# Patient Record
Sex: Female | Born: 1975 | Race: White | Hispanic: No | Marital: Married | Smoking: Former smoker
Health system: Southern US, Community
[De-identification: ages and names within clinical notes are randomized; demographics above are authoritative.]

## PROBLEM LIST (undated history)

## (undated) DIAGNOSIS — Z8041 Family history of malignant neoplasm of ovary: Secondary | ICD-10-CM

## (undated) DIAGNOSIS — Z800 Family history of malignant neoplasm of digestive organs: Secondary | ICD-10-CM

## (undated) DIAGNOSIS — O24419 Gestational diabetes mellitus in pregnancy, unspecified control: Secondary | ICD-10-CM

## (undated) DIAGNOSIS — T8859XA Other complications of anesthesia, initial encounter: Secondary | ICD-10-CM

## (undated) DIAGNOSIS — T4145XA Adverse effect of unspecified anesthetic, initial encounter: Secondary | ICD-10-CM

## (undated) HISTORY — DX: Family history of malignant neoplasm of digestive organs: Z80.0

## (undated) HISTORY — DX: Gestational diabetes mellitus in pregnancy, unspecified control: O24.419

## (undated) HISTORY — PX: DG CHOLECYSTOGRAPHY GALL BLADDER (ARMC HX): HXRAD1480

## (undated) HISTORY — DX: Family history of malignant neoplasm of ovary: Z80.41

---
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
## 2005-02-10 ENCOUNTER — Observation Stay: Payer: Self-pay | Admitting: Unknown Physician Specialty

## 2005-03-30 ENCOUNTER — Inpatient Hospital Stay: Payer: Self-pay | Admitting: Obstetrics & Gynecology

## 2006-04-23 LAB — BASIC METABOLIC PANEL
BUN: 11 mg/dL (ref 4–21)
CREATININE: 0.7 mg/dL (ref 0.5–1.1)
GLUCOSE: 91 mg/dL
SODIUM: 139 mmol/L (ref 137–147)

## 2006-04-23 LAB — LIPID PANEL
CHOLESTEROL: 233 mg/dL — AB (ref 0–200)
HDL: 87 mg/dL — AB (ref 35–70)
LDL Cholesterol: 125 mg/dL
LDl/HDL Ratio: 1.4
Triglycerides: 105 mg/dL (ref 40–160)

## 2006-04-23 LAB — CBC AND DIFFERENTIAL
NEUTROS ABS: 4 /uL
WBC: 7.8 10*3/mL

## 2006-04-23 LAB — HEPATIC FUNCTION PANEL: BILIRUBIN, TOTAL: 0.7 mg/dL

## 2008-10-01 ENCOUNTER — Ambulatory Visit: Payer: Self-pay | Admitting: Obstetrics & Gynecology

## 2008-10-02 ENCOUNTER — Inpatient Hospital Stay: Payer: Self-pay

## 2014-12-03 ENCOUNTER — Ambulatory Visit (INDEPENDENT_AMBULATORY_CARE_PROVIDER_SITE_OTHER): Payer: PRIVATE HEALTH INSURANCE | Admitting: Family Medicine

## 2014-12-03 DIAGNOSIS — Z23 Encounter for immunization: Secondary | ICD-10-CM

## 2014-12-18 ENCOUNTER — Encounter: Payer: Self-pay | Admitting: Emergency Medicine

## 2014-12-18 ENCOUNTER — Encounter: Payer: Self-pay | Admitting: Family Medicine

## 2014-12-18 ENCOUNTER — Emergency Department
Admission: EM | Admit: 2014-12-18 | Discharge: 2014-12-18 | Disposition: A | Discharge status: Home / Self Care | Payer: No Typology Code available for payment source | Attending: Emergency Medicine | Admitting: Emergency Medicine

## 2014-12-18 ENCOUNTER — Emergency Department: Payer: No Typology Code available for payment source

## 2014-12-18 ENCOUNTER — Ambulatory Visit (INDEPENDENT_AMBULATORY_CARE_PROVIDER_SITE_OTHER): Payer: PRIVATE HEALTH INSURANCE | Admitting: Family Medicine

## 2014-12-18 VITALS — BP 120/70 | HR 68 | Temp 98.1°F | Resp 16

## 2014-12-18 DIAGNOSIS — N200 Calculus of kidney: Secondary | ICD-10-CM | POA: Diagnosis not present

## 2014-12-18 DIAGNOSIS — R1031 Right lower quadrant pain: Secondary | ICD-10-CM

## 2014-12-18 DIAGNOSIS — Z3202 Encounter for pregnancy test, result negative: Secondary | ICD-10-CM | POA: Diagnosis not present

## 2014-12-18 DIAGNOSIS — R109 Unspecified abdominal pain: Secondary | ICD-10-CM | POA: Diagnosis present

## 2014-12-18 LAB — URINALYSIS COMPLETE WITH MICROSCOPIC (ARMC ONLY)
BILIRUBIN URINE: NEGATIVE
Bacteria, UA: NONE SEEN
GLUCOSE, UA: NEGATIVE mg/dL
Leukocytes, UA: NEGATIVE
NITRITE: NEGATIVE
Protein, ur: 30 mg/dL — AB
Specific Gravity, Urine: 1.019 (ref 1.005–1.030)
pH: 7 (ref 5.0–8.0)

## 2014-12-18 LAB — COMPREHENSIVE METABOLIC PANEL
ALBUMIN: 4.4 g/dL (ref 3.5–5.0)
ALK PHOS: 78 U/L (ref 38–126)
ALT: 51 U/L (ref 14–54)
AST: 50 U/L — AB (ref 15–41)
Anion gap: 13 (ref 5–15)
BILIRUBIN TOTAL: 1.4 mg/dL — AB (ref 0.3–1.2)
BUN: 12 mg/dL (ref 6–20)
CALCIUM: 9.4 mg/dL (ref 8.9–10.3)
CO2: 20 mmol/L — AB (ref 22–32)
CREATININE: 0.81 mg/dL (ref 0.44–1.00)
Chloride: 107 mmol/L (ref 101–111)
GFR calc Af Amer: >60 mL/min (ref >60)
GFR calc non Af Amer: >60 mL/min (ref >60)
GLUCOSE: 122 mg/dL — AB (ref 65–99)
Potassium: 2.9 mmol/L — CL (ref 3.5–5.1)
SODIUM: 140 mmol/L (ref 135–145)
TOTAL PROTEIN: 7.2 g/dL (ref 6.5–8.1)

## 2014-12-18 LAB — POCT URINALYSIS DIPSTICK
BILIRUBIN UA: NEGATIVE
Glucose, UA: NEGATIVE
LEUKOCYTES UA: NEGATIVE
NITRITE UA: NEGATIVE
PH UA: 7.5
PROTEIN UA: NEGATIVE
Spec Grav, UA: 1.015
Urobilinogen, UA: 1

## 2014-12-18 LAB — CBC WITH DIFFERENTIAL/PLATELET
BASOS PCT: 1 %
Basophils Absolute: 0.1 10*3/uL (ref 0–0.1)
EOS ABS: 0.1 10*3/uL (ref 0–0.7)
Eosinophils Relative: 1 %
HEMATOCRIT: 40.9 % (ref 35.0–47.0)
HEMOGLOBIN: 13.9 g/dL (ref 12.0–16.0)
Lymphocytes Relative: 25 %
Lymphs Abs: 2.5 10*3/uL (ref 1.0–3.6)
MCH: 30.5 pg (ref 26.0–34.0)
MCHC: 34 g/dL (ref 32.0–36.0)
MCV: 89.9 fL (ref 80.0–100.0)
MONOS PCT: 8 %
Monocytes Absolute: 0.8 10*3/uL (ref 0.2–0.9)
NEUTROS ABS: 6.5 10*3/uL (ref 1.4–6.5)
NEUTROS PCT: 65 %
Platelets: 285 10*3/uL (ref 150–440)
RBC: 4.55 MIL/uL (ref 3.80–5.20)
RDW: 13.2 % (ref 11.5–14.5)
WBC: 10 10*3/uL (ref 3.6–11.0)

## 2014-12-18 LAB — POCT URINE PREGNANCY: Preg Test, Ur: NEGATIVE

## 2014-12-18 LAB — POCT PREGNANCY, URINE: PREG TEST UR: NEGATIVE

## 2014-12-18 MED ORDER — ONDANSETRON HCL 4 MG/2ML IJ SOLN
4.0000 mg | Freq: Once | INTRAMUSCULAR | Status: AC
  Administered 2014-12-18: 4 mg via INTRAVENOUS

## 2014-12-18 MED ORDER — KETOROLAC TROMETHAMINE 30 MG/ML IJ SOLN
30.0000 mg | Freq: Once | INTRAMUSCULAR | Status: AC
  Administered 2014-12-18: 30 mg via INTRAVENOUS

## 2014-12-18 MED ORDER — OXYCODONE-ACETAMINOPHEN 5-325 MG PO TABS: 1.0000 | ORAL_TABLET | ORAL | Status: DC | PRN

## 2014-12-18 MED ORDER — ONDANSETRON HCL 4 MG PO TABS: 4.0000 mg | ORAL_TABLET | Freq: Three times a day (TID) | ORAL | Status: DC | PRN

## 2014-12-18 MED ORDER — ONDANSETRON HCL 4 MG/2ML IJ SOLN
INTRAMUSCULAR | Status: AC
  Administered 2014-12-18: 4 mg via INTRAVENOUS
  Filled 2014-12-18: qty 2

## 2014-12-18 MED ORDER — HYDROMORPHONE HCL 1 MG/ML IJ SOLN
1.0000 mg | INTRAMUSCULAR | Status: DC | PRN
  Administered 2014-12-18: 1 mg via INTRAVENOUS
  Filled 2014-12-18: qty 1

## 2014-12-18 MED ORDER — MORPHINE SULFATE (PF) 4 MG/ML IV SOLN
INTRAVENOUS | Status: AC
  Administered 2014-12-18: 4 mg via INTRAVENOUS
  Filled 2014-12-18: qty 1

## 2014-12-18 MED ORDER — KETOROLAC TROMETHAMINE 30 MG/ML IJ SOLN
INTRAMUSCULAR | Status: AC
Start: 2014-12-18 — End: 2014-12-18
  Administered 2014-12-18: 30 mg via INTRAVENOUS
  Filled 2014-12-18: qty 1

## 2014-12-18 MED ORDER — ONDANSETRON HCL 4 MG/2ML IJ SOLN
INTRAMUSCULAR | Status: AC
Start: 2014-12-18 — End: 2014-12-18
  Administered 2014-12-18: 4 mg via INTRAVENOUS
  Filled 2014-12-18: qty 2

## 2014-12-18 MED ORDER — MORPHINE SULFATE (PF) 4 MG/ML IV SOLN
4.0000 mg | Freq: Once | INTRAVENOUS | Status: AC
  Administered 2014-12-18: 4 mg via INTRAVENOUS

## 2014-12-18 MED ORDER — TAMSULOSIN HCL 0.4 MG PO CAPS: 0.4000 mg | ORAL_CAPSULE | Freq: Every day | ORAL | Status: DC

## 2014-12-18 NOTE — ED Provider Notes (Signed)
[facility] Emergency Department Provider Note    ____________________________________________  Time seen: 1215 I have reviewed the triage vital signs and the nursing notes.   HISTORY  Chief Complaint Flank Pain   History limited by: Not Limited   HPI Emily Warren is a 39 y.o. female who presents to the emergency department today with concerns for right flank pain. The patient states that the pain started suddenly. It started roughly 3 hours prior to my evaluation. It has been constant with a couple episodes of worsening pain. She is unable to describe whether it is a sharp, burning, cramping pain. She simply says hurts. It has been accompanied by nausea and vomiting. She denies any fevers. Denies any dysuria. She denies any diarrhea. She states that both her brother and her mother have had kidney stones in the past.   History reviewed. No pertinent past medical history.  Patient Active Problem List   Diagnosis Date Noted  . Ganglion 08/16/2009    History reviewed. No pertinent past surgical history.  No current outpatient prescriptions on file.  Allergies Review of patient's allergies indicates no known allergies.  No family history on file.  Social History Social History  Substance Use Topics  . Smoking status: Never Smoker   . Smokeless tobacco: None  . Alcohol Use: No    Review of Systems  Constitutional: Negative for fever. Cardiovascular: Negative for chest pain. Respiratory: Negative for shortness of breath. Gastrointestinal: Right flank pain. Nausea. Vomiting. Genitourinary: Negative for dysuria. Musculoskeletal: Negative for back pain. Skin: Negative for rash. Neurological: Negative for headaches, focal weakness or numbness.   10-point ROS otherwise negative.  ____________________________________________   PHYSICAL EXAM:  VITAL SIGNS: ED Triage Vitals  Enc Vitals Group     BP 12/18/14 1102 105/80 mmHg     Pulse  Rate 12/18/14 1102 73     Resp 12/18/14 1102 18     Temp 12/18/14 1102 97.6 F (36.4 C)     Temp Source 12/18/14 1102 Oral     SpO2 12/18/14 1102 97 %     Weight 12/18/14 1102 205 lb (92.987 kg)     Height 12/18/14 1102  (1.651 m)     Head Cir --      Peak Flow --      Pain Score 12/18/14 1102 8   Constitutional: Alert and oriented. Appears mildly uncomfortable Eyes: Conjunctivae are normal. PERRL. Normal extraocular movements. ENT   Head: Normocephalic and atraumatic.   Nose: No congestion/rhinnorhea.   Mouth/Throat: Mucous membranes are moist.   Neck: No stridor. Hematological/Lymphatic/Immunilogical: No cervical lymphadenopathy. Cardiovascular: Normal rate, regular rhythm.  No murmurs, rubs, or gallops. Respiratory: Normal respiratory effort without tachypnea nor retractions. Breath sounds are clear and equal bilaterally. No wheezes/rales/rhonchi. Gastrointestinal: Soft. Mild right-sided CVA tenderness. Mild right-sided abdominal pain. Genitourinary: Deferred Musculoskeletal: Normal range of motion in all extremities. No joint effusions.  No lower extremity tenderness nor edema. Neurologic:  Normal speech and language. No gross focal neurologic deficits are appreciated. Speech is normal.  Skin:  Skin is warm, dry and intact. No rash noted. Psychiatric: Mood and affect are normal. Speech and behavior are normal. Patient exhibits appropriate insight and judgment.  ____________________________________________    LABS (pertinent positives/negatives)  Labs Reviewed  COMPREHENSIVE METABOLIC PANEL - Abnormal; Notable for the following:    Potassium 2.9 (*)    CO2 20 (*)    Glucose, Bld 122 (*)    AST 50 (*)    Total  Bilirubin 1.4 (*)    All other components within normal limits  URINALYSIS COMPLETEWITH MICROSCOPIC ([facility] ONLY) - Abnormal; Notable for the following:    Color, Urine YELLOW (*)    APPearance CLEAR (*)    Ketones, ur 2+ (*)    Hgb urine dipstick  2+ (*)    Protein, ur 30 (*)    Squamous Epithelial / LPF 0-5 (*)    All other components within normal limits  CBC WITH DIFFERENTIAL/PLATELET  POCT PREGNANCY, URINE     ____________________________________________   EKG  None  ____________________________________________    RADIOLOGY  US Renal IMPRESSION: Study within normal limits.   ____________________________________________   PROCEDURES  Procedure(s) performed: None  Critical Care performed: No  ____________________________________________   INITIAL IMPRESSION / ASSESSMENT AND PLAN / ED COURSE  Pertinent labs & imaging results that were available during my care of the patient were reviewed by me and considered in my medical decision making (see chart for details).  Patient presents to the emergency department today with right flank pain. Urine is consistent with a kidney stone. At this time no sign of an infected stone. Renal ultrasound without any hydronephrosis. Will plan on discharging home with pain medication, antiemetics and Flomax  ____________________________________________   FINAL CLINICAL IMPRESSION(S) / ED DIAGNOSES  Final diagnoses:  Kidney stone  Flank pain     Phineas Semen, MD 12/18/14 [phone]

## 2014-12-18 NOTE — Progress Notes (Signed)
       Patient: Emily Warren Female    DOB: 1975-04-30   39 y.o.   MRN: [id number] Visit Date: 12/18/2014  Today's Provider: Mila Merry, MD   Chief Complaint  Patient presents with  . Flank Pain    Right   Subjective:    HPI  Right side pain started at 9:30 am this morning, came on suddenly. Radiates from side to back and down right buttocks. Was just walking in home when pain started. Was no lifting or carrying anything. Had no preceding illnesses. No fevers, chills, sweats. No dysuria or frequency. No constipation or diarrhea. States she feels numb all along right side of body.   Allergies not on file Previous Medications   No medications on file    Review of Systems  See HPI  Social History  Substance Use Topics  . Smoking status: Not on file  . Smokeless tobacco: Not on file  . Alcohol Use: Not on file   Objective:   BP 120/70 mmHg  Pulse 68  Temp(Src) 98.1 F (36.7 C) (Oral)  Resp 16  SpO2 100%  LMP 12/06/2014  Physical Exam  General: Patient in moderate to severe distress with pain. Awake, alert and oriented.  Back: No spine or CVA tenderness Abdomen: Mild RLQ tenderness with rebound and voluntary guarding.  Neuro: Normal LE strength    Assessment & Plan:     1. Right lower quadrant pain  Acute onset, patient in moderate to severe distress due to pain, which is unusual for this patient. Counseled of extensive Ddx including acute abdomen, kidney stones, herniated lumbar disk, ectopic pregnancy and ovarian rupture which will require additional diagnostic workup. Due to the acuity of her pain she was advised she should go to the emergency room for urgent evaluation. Her mother-in-law is with her and instructed to immediately go to ER for prompt evaluation. Patient agrees to do so. ER triage notified.       Mila Merry, MD  [facility]

## 2014-12-18 NOTE — ED Notes (Signed)
C/o right flank pain that radiates to RLQ area that started around 0930, denies any urinary symptoms

## 2014-12-18 NOTE — Discharge Instructions (Signed)
Please seek medical attention for any high fevers, chest pain, shortness of breath, change in behavior, persistent vomiting, bloody stool or any other new or concerning symptoms. ° ° °Kidney Stones °Kidney stones (urolithiasis) are deposits that form inside your kidneys. The intense pain is caused by the stone moving through the urinary tract. When the stone moves, the ureter goes into spasm around the stone. The stone is usually passed in the urine.  °CAUSES  °· A disorder that makes certain neck glands produce too much parathyroid hormone (primary hyperparathyroidism). °· A buildup of uric acid crystals, similar to gout in your joints. °· Narrowing (stricture) of the ureter. °· A kidney obstruction present at birth (congenital obstruction). °· Previous surgery on the kidney or ureters. °· Numerous kidney infections. °SYMPTOMS  °· Feeling sick to your stomach (nauseous). °· Throwing up (vomiting). °· Blood in the urine (hematuria). °· Pain that usually spreads (radiates) to the groin. °· Frequency or urgency of urination. °DIAGNOSIS  °· Taking a history and physical exam. °· Blood or urine tests. °· CT scan. °· Occasionally, an examination of the inside of the urinary bladder (cystoscopy) is performed. °TREATMENT  °· Observation. °· Increasing your fluid intake. °· Extracorporeal shock wave lithotripsy--This is a noninvasive procedure that uses shock waves to break up kidney stones. °· Surgery may be needed if you have severe pain or persistent obstruction. There are various surgical procedures. Most of the procedures are performed with the use of small instruments. Only small incisions are needed to accommodate these instruments, so recovery time is minimized. °The size, location, and chemical composition are all important variables that will determine the proper choice of action for you. Talk to your health care provider to better understand your situation so that you will minimize the risk of injury to yourself  and your kidney.  °HOME CARE INSTRUCTIONS  °· Drink enough water and fluids to keep your urine clear or pale yellow. This will help you to pass the stone or stone fragments. °· Strain all urine through the provided strainer. Keep all particulate matter and stones for your health care provider to see. The stone causing the pain may be as small as a grain of salt. It is very important to use the strainer each and every time you pass your urine. The collection of your stone will allow your health care provider to analyze it and verify that a stone has actually passed. The stone analysis will often identify what you can do to reduce the incidence of recurrences. °· Only take over-the-counter or prescription medicines for pain, discomfort, or fever as directed by your health care provider. °· Keep all follow-up visits as told by your health care provider. This is important. °· Get follow-up X-rays if required. The absence of pain does not always mean that the stone has passed. It may have only stopped moving. If the urine remains completely obstructed, it can cause loss of kidney function or even complete destruction of the kidney. It is your responsibility to make sure X-rays and follow-ups are completed. Ultrasounds of the kidney can show blockages and the status of the kidney. Ultrasounds are not associated with any radiation and can be performed easily in a matter of minutes. °· Make changes to your daily diet as told by your health care provider. You may be told to: °¨ Limit the amount of salt that you eat. °¨ Eat 5 or more servings of fruits and vegetables each day. °¨ Limit the amount of meat,   poultry, fish, and eggs that you eat. °· Collect a 24-hour urine sample as told by your health care provider. You may need to collect another urine sample every 6-12 months. °SEEK MEDICAL CARE IF: °· You experience pain that is progressive and unresponsive to any pain medicine you have been prescribed. °SEEK IMMEDIATE  MEDICAL CARE IF:  °· Pain cannot be controlled with the prescribed medicine. °· You have a fever or shaking chills. °· The severity or intensity of pain increases over 18 hours and is not relieved by pain medicine. °· You develop a new onset of abdominal pain. °· You feel faint or pass out. °· You are unable to urinate. °  °This information is not intended to replace advice given to you by your health care provider. Make sure you discuss any questions you have with your health care provider. °  °Document Released: 02/23/2005 Document Revised: 11/14/2014 Document Reviewed: 07/27/2012 °Elsevier Interactive Patient Education ©2016 [employer] Inc[employer] ° °

## 2014-12-20 ENCOUNTER — Encounter: Payer: Self-pay | Admitting: Family Medicine

## 2014-12-20 ENCOUNTER — Ambulatory Visit (INDEPENDENT_AMBULATORY_CARE_PROVIDER_SITE_OTHER): Payer: PRIVATE HEALTH INSURANCE | Admitting: Family Medicine

## 2014-12-20 VITALS — BP 118/80 | HR 78 | Temp 98.6°F | Resp 16 | Wt 208.0 lb

## 2014-12-20 DIAGNOSIS — N200 Calculus of kidney: Secondary | ICD-10-CM | POA: Diagnosis not present

## 2014-12-20 NOTE — Progress Notes (Signed)
Patient ID: Emily Warren Broussard, female   DOB: 05/20/1975, 39 y.o.   MRN: [id number]   Emily Warren Emminger  MRN: [id number] DOB: 05/10/1975  Subjective:  HPI   1. Kidney stone Patient is a 39 year old female who presents for follow up after being seen in the ER for kidney stones 2 days ago.  She states she feels much better, not 100% but if she wasn't told to follow up she feels she wold not need to be here.  Patient did share that she has a strong family history of kidney stones but this was the first for her.   The ER put her on Flomax but the patient has not started it and feels she does not need it unless told today to take it. Patient has strong family history of kidney stones.   Patient Active Problem List   Diagnosis Date Noted  . Ganglion 08/16/2009    No past medical history on file.  Social History   Social History  . Marital Status: Married    Spouse Name: N/A  . Number of Children: N/A  . Years of Education: N/A   Occupational History  . Not on file.   Social History Main Topics  . Smoking status: Never Smoker   . Smokeless tobacco: Not on file  . Alcohol Use: No  . Drug Use: No  . Sexual Activity: Not on file   Other Topics Concern  . Not on file   Social History Narrative    Outpatient Prescriptions Prior to Visit  Medication Sig Dispense Refill  . ondansetron (ZOFRAN) 4 MG tablet Take 1 tablet (4 mg total) by mouth every 8 (eight) hours as needed for nausea or vomiting. 20 tablet 0  . oxyCODONE-acetaminophen (ROXICET) 5-325 MG tablet Take 1 tablet by mouth every 4 (four) hours as needed for severe pain. 15 tablet 0  . tamsulosin (FLOMAX) 0.4 MG CAPS capsule Take 1 capsule (0.4 mg total) by mouth daily. (Patient not taking: Reported on 12/20/2014) 14 capsule 0   No facility-administered medications prior to visit.    No Known Allergies  Review of Systems  Constitutional: Negative for fever and chills.  Respiratory: Negative for cough, hemoptysis,  shortness of breath and wheezing.   Cardiovascular: Negative for chest pain, palpitations, orthopnea and leg swelling.  Gastrointestinal: Negative.   Genitourinary: Negative for dysuria, urgency, frequency, hematuria and flank pain.       Pressure when she has to urinate and occasionally after she urinates.  Neurological: Negative for dizziness, weakness and headaches.  Psychiatric/Behavioral: Negative.    Objective:  BP 118/80 mmHg  Pulse 78  Temp(Src) 98.6 F (37 C) (Oral)  Resp 16  Wt 208 lb (94.348 kg)  LMP 12/06/2014  Physical Exam  Constitutional: She is oriented to person, place, and time and well-developed, well-nourished, and in no distress.  HENT:  Head: Normocephalic and atraumatic.  Right Ear: External ear normal.  Left Ear: External ear normal.  Nose: Nose normal.  Eyes: Conjunctivae are normal.  Cardiovascular: Normal rate, regular rhythm and normal heart sounds.   Pulmonary/Chest: Effort normal and breath sounds normal.  Abdominal: Soft. She exhibits no distension and no mass. There is no tenderness. There is no rebound and no guarding.  Neurological: She is alert and oriented to person, place, and time.  Skin: Skin is warm and dry.  Psychiatric: Mood, memory, affect and judgment normal.    Assessment and Plan :  Kidney stone  1. Kidney stone No  treatment needed if this does not recur. If it does recur or refer to urology. Patient advised push fluids. Take Flomax for 2 weeks plus an anti-inflammatory daily for those 2 weeks 2. Obesity Patient has had a 25 pound intentional weight loss with diet and exercises here. Julieanne Manson MD [facility] 12/20/2014 11:02 AM

## 2016-02-04 LAB — HM PAP SMEAR

## 2016-02-04 LAB — OB RESULTS CONSOLE RUBELLA ANTIBODY, IGM: RUBELLA: IMMUNE

## 2016-02-04 LAB — OB RESULTS CONSOLE HGB/HCT, BLOOD
HCT: 38 %
HEMOGLOBIN: 13.1 g/dL

## 2016-02-04 LAB — OB RESULTS CONSOLE ABO/RH: RH TYPE: POSITIVE

## 2016-02-04 LAB — OB RESULTS CONSOLE ANTIBODY SCREEN: Antibody Screen: NEGATIVE

## 2016-02-04 LAB — OB RESULTS CONSOLE VARICELLA ZOSTER ANTIBODY, IGG: Varicella: IMMUNE

## 2016-02-04 LAB — OB RESULTS CONSOLE HEPATITIS B SURFACE ANTIGEN: HEP B S AG: NEGATIVE

## 2016-02-04 LAB — OB RESULTS CONSOLE PLATELET COUNT: Platelets: 282 10*3/uL

## 2016-02-04 LAB — OB RESULTS CONSOLE RPR: RPR: NONREACTIVE

## 2016-02-04 LAB — OB RESULTS CONSOLE GC/CHLAMYDIA
Chlamydia: NEGATIVE
GC PROBE AMP, GENITAL: NEGATIVE

## 2016-02-04 LAB — OB RESULTS CONSOLE HIV ANTIBODY (ROUTINE TESTING): HIV: NONREACTIVE

## 2016-05-07 ENCOUNTER — Other Ambulatory Visit: Payer: Self-pay | Admitting: Obstetrics and Gynecology

## 2016-05-07 DIAGNOSIS — IMO0002 Reserved for concepts with insufficient information to code with codable children: Secondary | ICD-10-CM

## 2016-05-07 DIAGNOSIS — Z0489 Encounter for examination and observation for other specified reasons: Secondary | ICD-10-CM

## 2016-05-08 ENCOUNTER — Telehealth: Payer: Self-pay

## 2016-05-08 DIAGNOSIS — Z20828 Contact with and (suspected) exposure to other viral communicable diseases: Secondary | ICD-10-CM

## 2016-05-08 MED ORDER — OSELTAMIVIR PHOSPHATE 75 MG PO CAPS: 75.0000 mg | ORAL_CAPSULE | Freq: Every day | ORAL | 0 refills | Status: AC

## 2016-05-08 NOTE — Telephone Encounter (Signed)
Pt is 22 wks.  Her son was dx'd yesterday c flu type B and was given tamiflu.  Any precautions for her?

## 2016-05-08 NOTE — Telephone Encounter (Signed)
Pt aware.

## 2016-05-08 NOTE — Telephone Encounter (Signed)
Prevention with Tamiflu.  Will Rx.  Dose is once daily to prevent for 10 days

## 2016-05-14 LAB — HPV APTIMA: HPV APTIMA: NEGATIVE

## 2016-05-15 ENCOUNTER — Ambulatory Visit (INDEPENDENT_AMBULATORY_CARE_PROVIDER_SITE_OTHER): Payer: Medicaid Other

## 2016-05-15 ENCOUNTER — Ambulatory Visit (INDEPENDENT_AMBULATORY_CARE_PROVIDER_SITE_OTHER): Payer: No Typology Code available for payment source | Admitting: Obstetrics & Gynecology

## 2016-05-15 ENCOUNTER — Telehealth: Payer: Self-pay | Admitting: Obstetrics & Gynecology

## 2016-05-15 VITALS — BP 110/60 | HR 82 | Wt 205.0 lb

## 2016-05-15 DIAGNOSIS — Z362 Encounter for other antenatal screening follow-up: Secondary | ICD-10-CM

## 2016-05-15 DIAGNOSIS — Z0489 Encounter for examination and observation for other specified reasons: Secondary | ICD-10-CM

## 2016-05-15 DIAGNOSIS — Z131 Encounter for screening for diabetes mellitus: Secondary | ICD-10-CM

## 2016-05-15 DIAGNOSIS — Z3A23 23 weeks gestation of pregnancy: Secondary | ICD-10-CM

## 2016-05-15 DIAGNOSIS — IMO0002 Reserved for concepts with insufficient information to code with codable children: Secondary | ICD-10-CM

## 2016-05-15 DIAGNOSIS — Z98891 History of uterine scar from previous surgery: Secondary | ICD-10-CM | POA: Insufficient documentation

## 2016-05-15 DIAGNOSIS — Z3490 Encounter for supervision of normal pregnancy, unspecified, unspecified trimester: Secondary | ICD-10-CM | POA: Insufficient documentation

## 2016-05-15 NOTE — Telephone Encounter (Signed)
Lmtrc

## 2016-05-18 NOTE — Telephone Encounter (Signed)
Lmtrc

## 2016-05-19 NOTE — Telephone Encounter (Signed)
Patient is aware of H&P on 09/02/16 @ 8am with Pre-Admit Testing to follow, and OR on 09/03/16.

## 2016-06-16 ENCOUNTER — Ambulatory Visit (INDEPENDENT_AMBULATORY_CARE_PROVIDER_SITE_OTHER): Payer: Medicaid Other | Admitting: Obstetrics & Gynecology

## 2016-06-16 ENCOUNTER — Other Ambulatory Visit: Payer: Medicaid Other

## 2016-06-16 VITALS — BP 120/70 | Wt 208.0 lb

## 2016-06-16 DIAGNOSIS — Z3490 Encounter for supervision of normal pregnancy, unspecified, unspecified trimester: Secondary | ICD-10-CM

## 2016-06-16 DIAGNOSIS — Z3A27 27 weeks gestation of pregnancy: Secondary | ICD-10-CM

## 2016-06-16 DIAGNOSIS — Z98891 History of uterine scar from previous surgery: Secondary | ICD-10-CM

## 2016-06-16 NOTE — Progress Notes (Signed)
Glc today, BTL papers signed, plans CS 6/28 (PH), PNV, [facility], back pain discussed.

## 2016-06-17 ENCOUNTER — Telehealth: Payer: Self-pay | Admitting: Obstetrics & Gynecology

## 2016-06-17 LAB — 28 WEEK RH+PANEL
BASOS ABS: 0 10*3/uL (ref 0.0–0.2)
Basos: 0 %
EOS (ABSOLUTE): 0.3 10*3/uL (ref 0.0–0.4)
Eos: 3 %
Gestational Diabetes Screen: 163 mg/dL — ABNORMAL HIGH (ref 65–139)
HEMATOCRIT: 34.3 % (ref 34.0–46.6)
HIV SCREEN 4TH GENERATION: NONREACTIVE
Hemoglobin: 11.7 g/dL (ref 11.1–15.9)
Immature Grans (Abs): 0.1 10*3/uL (ref 0.0–0.1)
Immature Granulocytes: 1 %
LYMPHS ABS: 1.9 10*3/uL (ref 0.7–3.1)
LYMPHS: 15 %
MCH: 30.2 pg (ref 26.6–33.0)
MCHC: 34.1 g/dL (ref 31.5–35.7)
MCV: 89 fL (ref 79–97)
MONOCYTES: 3 %
Monocytes Absolute: 0.4 10*3/uL (ref 0.1–0.9)
Neutrophils Absolute: 9.8 10*3/uL — ABNORMAL HIGH (ref 1.4–7.0)
Neutrophils: 78 %
Platelets: 249 10*3/uL (ref 150–379)
RBC: 3.87 x10E6/uL (ref 3.77–5.28)
RDW: 12.9 % (ref 12.3–15.4)
RPR Ser Ql: NONREACTIVE
WBC: 12.4 10*3/uL — ABNORMAL HIGH (ref 3.4–10.8)

## 2016-06-17 NOTE — Progress Notes (Signed)
Pt has elevated Glucola and is need of 3 hour GTT.  Please help arrange.  Thanks.

## 2016-06-17 NOTE — Progress Notes (Signed)
Please call and schedule 3 hr, pt aware you will be calling, maybe she can do it with next appt

## 2016-06-17 NOTE — Telephone Encounter (Signed)
Pt is schedule on 07/07/16 At 8:20

## 2016-06-18 NOTE — Telephone Encounter (Signed)
Per Dr. Tiburcio Pea to schedule an 3 hour GTT

## 2016-07-07 ENCOUNTER — Other Ambulatory Visit: Payer: Medicaid Other

## 2016-07-08 ENCOUNTER — Ambulatory Visit (INDEPENDENT_AMBULATORY_CARE_PROVIDER_SITE_OTHER): Payer: Medicaid Other | Admitting: Obstetrics and Gynecology

## 2016-07-08 ENCOUNTER — Other Ambulatory Visit: Payer: Medicaid Other

## 2016-07-08 VITALS — BP 116/60 | Wt 212.0 lb

## 2016-07-08 DIAGNOSIS — Z23 Encounter for immunization: Secondary | ICD-10-CM | POA: Diagnosis not present

## 2016-07-08 DIAGNOSIS — Z3A30 30 weeks gestation of pregnancy: Secondary | ICD-10-CM | POA: Diagnosis not present

## 2016-07-08 DIAGNOSIS — O99810 Abnormal glucose complicating pregnancy: Secondary | ICD-10-CM

## 2016-07-08 NOTE — Progress Notes (Signed)
102004  

## 2016-07-08 NOTE — Addendum Note (Signed)
Addended by: Kathlene Cote on: 07/08/2016 08:12 AM   Modules accepted: Orders

## 2016-07-08 NOTE — Progress Notes (Signed)
3 hour GTT today/TDAP given today/questions regarding the blood transfusion

## 2016-07-09 ENCOUNTER — Other Ambulatory Visit: Payer: Self-pay | Admitting: Obstetrics and Gynecology

## 2016-07-09 ENCOUNTER — Telehealth: Payer: Self-pay

## 2016-07-09 DIAGNOSIS — O24410 Gestational diabetes mellitus in pregnancy, diet controlled: Secondary | ICD-10-CM

## 2016-07-09 LAB — GESTATIONAL GLUCOSE TOLERANCE
GLUCOSE 2 HOUR GTT: 163 mg/dL — AB (ref 65–154)
GLUCOSE 3 HOUR GTT: 48 mg/dL — AB (ref 65–139)
Glucose, Fasting: 73 mg/dL (ref 65–94)
Glucose, GTT - 1 Hour: 181 mg/dL — ABNORMAL HIGH (ref 65–179)

## 2016-07-09 MED ORDER — GLUCOSE BLOOD VI STRP: ORAL_STRIP | 12 refills | Status: DC

## 2016-07-09 MED ORDER — ACCU-CHEK NANO SMARTVIEW W/DEVICE KIT: 1.0000 | PACK | 0 refills | Status: DC

## 2016-07-09 MED ORDER — ACCU-CHEK FASTCLIX LANCETS MISC: 1.0000 [IU] | Freq: Four times a day (QID) | 12 refills | Status: DC

## 2016-07-09 NOTE — Telephone Encounter (Signed)
Left voicemail GDM lifestyle referral and testing supplies sent

## 2016-07-09 NOTE — Telephone Encounter (Signed)
Please advise 

## 2016-07-09 NOTE — Progress Notes (Signed)
Pt states she is not concerned about this since she passed 2 of the 4 test.  Pt advised she would be referred to the lifestyle center, [facility]

## 2016-07-09 NOTE — Progress Notes (Signed)
Left message to return call 

## 2016-07-09 NOTE — Telephone Encounter (Signed)
Pt calling for results of 3hr GTT.  Okay to leave on vm.  [phone]

## 2016-07-15 ENCOUNTER — Encounter: Payer: No Typology Code available for payment source | Admitting: Certified Nurse Midwife

## 2016-07-15 ENCOUNTER — Other Ambulatory Visit: Payer: No Typology Code available for payment source

## 2016-07-17 ENCOUNTER — Encounter: Payer: Self-pay | Admitting: *Deleted

## 2016-07-17 ENCOUNTER — Encounter: Payer: Medicaid Other | Attending: Obstetrics & Gynecology | Admitting: *Deleted

## 2016-07-17 VITALS — BP 108/62 | Ht 64.5 in | Wt 212.4 lb

## 2016-07-17 DIAGNOSIS — Z3A00 Weeks of gestation of pregnancy not specified: Secondary | ICD-10-CM | POA: Diagnosis not present

## 2016-07-17 DIAGNOSIS — O24410 Gestational diabetes mellitus in pregnancy, diet controlled: Secondary | ICD-10-CM | POA: Diagnosis not present

## 2016-07-17 NOTE — Progress Notes (Signed)
Diabetes Self-Management Education  Visit Type: First/Initial  Appt. Start Time: 1010 Appt. End Time: 8546  07/17/2016  [name], identified by name and date of birth, is a 41 y.o. female with a diagnosis of Diabetes: Gestational Diabetes.   ASSESSMENT  Blood pressure 108/62, height 5' 4.5" (1.638 m), weight 212 lb 6.4 oz (96.3 kg), last menstrual period 12/05/2015. Body mass index is 35.9 kg/m.      Diabetes Self-Management Education - 07/17/16 1201      Visit Information   Visit Type First/Initial     Initial Visit   Diabetes Type Gestational Diabetes   Are you currently following a meal plan? No   Are you taking your medications as prescribed? Yes   Date Diagnosed 07/08/16     Health Coping   How would you rate your overall health? Good     Psychosocial Assessment   Patient Belief/Attitude about Diabetes Motivated to manage diabetes   Self-care barriers None   Self-management support Doctor's office;Friends;Family   Patient Concerns Nutrition/Meal planning;Glycemic Control   Special Needs None   Preferred Learning Style Auditory   Learning Readiness Ready   How often do you need to have someone help you when you read instructions, pamphlets, or other written materials from your doctor or pharmacy? 1 - Never   What is the last grade level you completed in school? college     Pre-Education Assessment   Patient understands the diabetes disease and treatment process. Needs Instruction   Patient understands incorporating nutritional management into lifestyle. Needs Instruction   Patient undertands incorporating physical activity into lifestyle. Needs Instruction   Patient understands using medications safely. Needs Instruction   Patient understands monitoring blood glucose, interpreting and using results Needs Review   Patient understands prevention, detection, and treatment of acute complications. Needs Instruction   Patient understands prevention, detection,  and treatment of chronic complications. Needs Instruction   Patient understands how to develop strategies to address psychosocial issues. Needs Instruction   Patient understands how to develop strategies to promote health/change behavior. Needs Instruction     Complications   How often do you check your blood sugar? 3-4 times/day   Fasting Blood glucose range (mg/dL) 70-129  FBG's 97, 101, 80 and 83 mg/dL.    Postprandial Blood glucose range (mg/dL) 70-129;130-179  pp's breakfast 86, 93, 104, 96, 122 mg/dL; pp lunch 109, 131, 118, 84 mg/dL; pp supper 142, 80, 98, 114 mg/dL.    Have you had a dilated eye exam in the past 12 months? Yes   Have you had a dental exam in the past 12 months? Yes   Are you checking your feet? No     Dietary Intake   Breakfast setak and cheese biscuit; eggs with cheese and toast; egg white grill; egg and cheese sandwich   Lunch grilled chicken salad with hashbrown casserole, biscuit with butter and jelly; taco salad; pizza and bites of cookie   Dinner cube steak with gravy, rice, green beans, 2 bites of cupcake; chicken pie, mac-n-cheese and green beans; chicken sandwich; grilled chicken sandwich with small fries   Beverage(s) water, regular soda (Dr. Malachi Bonds)     Exercise   Exercise Type Light (walking / raking leaves)   How many days per week to you exercise? 3   How many minutes per day do you exercise? 30   Total minutes per week of exercise 90     Patient Education   Previous Diabetes Education No   Disease  state  Definition of diabetes, type 1 and 2, and the diagnosis of diabetes;Factors that contribute to the development of diabetes   Nutrition management  Role of diet in the treatment of diabetes and the relationship between the three main macronutrients and blood glucose level;Food label reading, portion sizes and measuring food.;Reviewed blood glucose goals for pre and post meals and how to evaluate the patients' food intake on their blood glucose  level.   Physical activity and exercise  Role of exercise on diabetes management, blood pressure control and cardiac health.   Monitoring Purpose and frequency of SMBG.;Taught/discussed recording of test results and interpretation of SMBG.;Ketone testing, when, how.   Chronic complications Relationship between chronic complications and blood glucose control   Psychosocial adjustment Identified and addressed patients feelings and concerns about diabetes   Preconception care Pregnancy and GDM  Role of pre-pregnancy blood glucose control on the development of the fetus;Reviewed with patient blood glucose goals with pregnancy;Role of family planning for patients with diabetes     Individualized Goals (developed by patient)   Reducing Risk Improve blood sugars     Outcomes   Expected Outcomes Demonstrated interest in learning. Expect positive outcomes   Program Status Not Completed      Individualized Plan for Diabetes Self-Management Training:   Learning Objective:  Patient will have a greater understanding of diabetes self-management. Patient education plan is to attend individual and/or group sessions per assessed needs and concerns.   Plan:   Patient Instructions  Read booklet on Gestational Diabetes Follow Gestational Meal Planning Guidelines Avoid sugar sweetened drinks Check blood sugars 4 x day - before breakfast and 2 hrs after every meal and record  Bring blood sugar log to all appointments Purchase urine ketone strips if blood sugars not controlled and check urine ketones every am:  If + increase bedtime snack to 1 protein and 2 carbohydrate servings Walk 20-30 minutes at least 5 x week if permitted by MD   Expected Outcomes:  Demonstrated interest in learning. Expect positive outcomes  Education material provided:  Gestational Booklet Gestational Meal Planning Guidelines Simple Meal Plan Viewed Gestational Diabetes Video Goals for a Healthy Pregnancy  If problems or  questions, patient to contact team via:  {Sheila [place], RN, CCM, CDE [phone]  Future DSME appointment:  No follow up visit with the dietitian scheduled at this time. Patient reports that she is able to manage her gestational diabetes. She has been trying different high carbohydrate meals to see how foods affect her blood sugars.

## 2016-07-17 NOTE — Patient Instructions (Signed)
Read booklet on Gestational Diabetes Follow Gestational Meal Planning Guidelines Avoid sugar sweetened drinks Check blood sugars 4 x day - before breakfast and 2 hrs after every meal and record  Bring blood sugar log to all appointments Purchase urine ketone strips if blood sugars not controlled and check urine ketones every am:  If + increase bedtime snack to 1 protein and 2 carbohydrate servings Walk 20-30 minutes at least 5 x week if permitted by MD

## 2016-07-22 ENCOUNTER — Ambulatory Visit (INDEPENDENT_AMBULATORY_CARE_PROVIDER_SITE_OTHER): Payer: Medicaid Other | Admitting: Advanced Practice Midwife

## 2016-07-22 VITALS — BP 118/74 | Wt 213.0 lb

## 2016-07-22 DIAGNOSIS — Z3A32 32 weeks gestation of pregnancy: Secondary | ICD-10-CM

## 2016-07-22 NOTE — Progress Notes (Signed)
Doing well. Good fetal movement. Blood Sugars are WNL. Encouraged continued healthy diet and exercise.

## 2016-08-05 ENCOUNTER — Ambulatory Visit (INDEPENDENT_AMBULATORY_CARE_PROVIDER_SITE_OTHER): Payer: Medicaid Other | Admitting: Obstetrics & Gynecology

## 2016-08-05 VITALS — BP 120/80 | Wt 216.0 lb

## 2016-08-05 DIAGNOSIS — Z98891 History of uterine scar from previous surgery: Secondary | ICD-10-CM

## 2016-08-05 DIAGNOSIS — Z3490 Encounter for supervision of normal pregnancy, unspecified, unspecified trimester: Secondary | ICD-10-CM

## 2016-08-05 DIAGNOSIS — Z3A34 34 weeks gestation of pregnancy: Secondary | ICD-10-CM

## 2016-08-05 NOTE — Progress Notes (Signed)
PNV, FMC, Labor precautions, CS and BTL planned. Breast.

## 2016-08-05 NOTE — Patient Instructions (Signed)
Third Trimester of Pregnancy The third trimester is from week 28 through week 40 (months 7 through 9). The third trimester is a time when the unborn baby (fetus) is growing rapidly. At the end of the ninth month, the fetus is about 20 inches in length and weighs 6-10 pounds. Body changes during your third trimester Your body will continue to go through many changes during pregnancy. The changes vary from woman to woman. During the third trimester:  Your weight will continue to increase. You can expect to gain 25-35 pounds (11-16 kg) by the end of the pregnancy.  You may begin to get stretch marks on your hips, abdomen, and breasts.  You may urinate more often because the fetus is moving lower into your pelvis and pressing on your bladder.  You may develop or continue to have heartburn. This is caused by increased hormones that slow down muscles in the digestive tract.  You may develop or continue to have constipation because increased hormones slow digestion and cause the muscles that push waste through your intestines to relax.  You may develop hemorrhoids. These are swollen veins (varicose veins) in the rectum that can itch or be painful.  You may develop swollen, bulging veins (varicose veins) in your legs.  You may have increased body aches in the pelvis, back, or thighs. This is due to weight gain and increased hormones that are relaxing your joints.  You may have changes in your hair. These can include thickening of your hair, rapid growth, and changes in texture. Some women also have hair loss during or after pregnancy, or hair that feels dry or thin. Your hair will most likely return to normal after your baby is born.  Your breasts will continue to grow and they will continue to become tender. A yellow fluid (colostrum) may leak from your breasts. This is the first milk you are producing for your baby.  Your belly button may stick out.  You may notice more swelling in your hands,  face, or ankles.  You may have increased tingling or numbness in your hands, arms, and legs. The skin on your belly may also feel numb.  You may feel short of breath because of your expanding uterus.  You may have more problems sleeping. This can be caused by the size of your belly, increased need to urinate, and an increase in your body's metabolism.  You may notice the fetus "dropping," or moving lower in your abdomen (lightening).  You may have increased vaginal discharge.  You may notice your joints feel loose and you may have pain around your pelvic bone.  What to expect at prenatal visits You will have prenatal exams every 2 weeks until week 36. Then you will have weekly prenatal exams. During a routine prenatal visit:  You will be weighed to make sure you and the baby are growing normally.  Your blood pressure will be taken.  Your abdomen will be measured to track your baby's growth.  The fetal heartbeat will be listened to.  Any test results from the previous visit will be discussed.  You may have a cervical check near your due date to see if your cervix has softened or thinned (effaced).  You will be tested for Group B streptococcus. This happens between 35 and 37 weeks.  Your health care provider may ask you:  What your birth plan is.  How you are feeling.  If you are feeling the baby move.  If you have had   any abnormal symptoms, such as leaking fluid, bleeding, severe headaches, or abdominal cramping.  If you are using any tobacco products, including cigarettes, chewing tobacco, and electronic cigarettes.  If you have any questions.  Other tests or screenings that may be performed during your third trimester include:  Blood tests that check for low iron levels (anemia).  Fetal testing to check the health, activity level, and growth of the fetus. Testing is done if you have certain medical conditions or if there are problems during the  pregnancy.  Nonstress test (NST). This test checks the health of your baby to make sure there are no signs of problems, such as the baby not getting enough oxygen. During this test, a belt is placed around your belly. The baby is made to move, and its heart rate is monitored during movement.  What is false labor? False labor is a condition in which you feel small, irregular tightenings of the muscles in the womb (contractions) that usually go away with rest, changing position, or drinking water. These are called Braxton Hicks contractions. Contractions may last for hours, days, or even weeks before true labor sets in. If contractions come at regular intervals, become more frequent, increase in intensity, or become painful, you should see your health care provider. What are the signs of labor?  Abdominal cramps.  Regular contractions that start at 10 minutes apart and become stronger and more frequent with time.  Contractions that start on the top of the uterus and spread down to the lower abdomen and back.  Increased pelvic pressure and dull back pain.  A watery or bloody mucus discharge that comes from the vagina.  Leaking of amniotic fluid. This is also known as your "water breaking." It could be a slow trickle or a gush. Let your health care provider know if it has a color or strange odor. If you have any of these signs, call your health care provider right away, even if it is before your due date. Follow these instructions at home: Medicines  Follow your health care provider's instructions regarding medicine use. Specific medicines may be either safe or unsafe to take during pregnancy.  Take a prenatal vitamin that contains at least 600 micrograms (mcg) of folic acid.  If you develop constipation, try taking a stool softener if your health care provider approves. Eating and drinking  Eat a balanced diet that includes fresh fruits and vegetables, whole grains, good sources of protein  such as meat, eggs, or tofu, and low-fat dairy. Your health care provider will help you determine the amount of weight gain that is right for you.  Avoid raw meat and uncooked cheese. These carry germs that can cause birth defects in the baby.  If you have low calcium intake from food, talk to your health care provider about whether you should take a daily calcium supplement.  Eat four or five small meals rather than three large meals a day.  Limit foods that are high in fat and processed sugars, such as fried and sweet foods.  To prevent constipation: ? Drink enough fluid to keep your urine clear or pale yellow. ? Eat foods that are high in fiber, such as fresh fruits and vegetables, whole grains, and beans. Activity  Exercise only as directed by your health care provider. Most women can continue their usual exercise routine during pregnancy. Try to exercise for 30 minutes at least 5 days a week. Stop exercising if you experience uterine contractions.  Avoid heavy   lifting.  Do not exercise in extreme heat or humidity, or at high altitudes.  Wear low-heel, comfortable shoes.  Practice good posture.  You may continue to have sex unless your health care provider tells you otherwise. Relieving pain and discomfort  Take frequent breaks and rest with your legs elevated if you have leg cramps or low back pain.  Take warm sitz baths to soothe any pain or discomfort caused by hemorrhoids. Use hemorrhoid cream if your health care provider approves.  Wear a good support bra to prevent discomfort from breast tenderness.  If you develop varicose veins: ? Wear support pantyhose or compression stockings as told by your healthcare provider. ? Elevate your feet for 15 minutes, 3-4 times a day. Prenatal care  Write down your questions. Take them to your prenatal visits.  Keep all your prenatal visits as told by your health care provider. This is important. Safety  Wear your seat belt at  all times when driving.  Make a list of emergency phone numbers, including numbers for family, friends, the hospital, and police and fire departments. General instructions  Avoid cat litter boxes and soil used by cats. These carry germs that can cause birth defects in the baby. If you have a cat, ask someone to clean the litter box for you.  Do not travel far distances unless it is absolutely necessary and only with the approval of your health care provider.  Do not use hot tubs, steam rooms, or saunas.  Do not drink alcohol.  Do not use any products that contain nicotine or tobacco, such as cigarettes and e-cigarettes. If you need help quitting, ask your health care provider.  Do not use any medicinal herbs or unprescribed drugs. These chemicals affect the formation and growth of the baby.  Do not douche or use tampons or scented sanitary pads.  Do not cross your legs for long periods of time.  To prepare for the arrival of your baby: ? Take prenatal classes to understand, practice, and ask questions about labor and delivery. ? Make a trial run to the hospital. ? Visit the hospital and tour the maternity area. ? Arrange for maternity or paternity leave through employers. ? Arrange for family and friends to take care of pets while you are in the hospital. ? Purchase a rear-facing car seat and make sure you know how to install it in your car. ? Pack your hospital bag. ? Prepare the baby's nursery. Make sure to remove all pillows and stuffed animals from the baby's crib to prevent suffocation.  Visit your dentist if you have not gone during your pregnancy. Use a soft toothbrush to brush your teeth and be gentle when you floss. Contact a health care provider if:  You are unsure if you are in labor or if your water has broken.  You become dizzy.  You have mild pelvic cramps, pelvic pressure, or nagging pain in your abdominal area.  You have lower back pain.  You have persistent  nausea, vomiting, or diarrhea.  You have an unusual or bad smelling vaginal discharge.  You have pain when you urinate. Get help right away if:  Your water breaks before 37 weeks.  You have regular contractions less than 5 minutes apart before 37 weeks.  You have a fever.  You are leaking fluid from your vagina.  You have spotting or bleeding from your vagina.  You have severe abdominal pain or cramping.  You have rapid weight loss or weight gain.    You have shortness of breath with chest pain.  You notice sudden or extreme swelling of your face, hands, ankles, feet, or legs.  Your baby makes fewer than 10 movements in 2 hours.  You have severe headaches that do not go away when you take medicine.  You have vision changes. Summary  The third trimester is from week 28 through week 40, months 7 through 9. The third trimester is a time when the unborn baby (fetus) is growing rapidly.  During the third trimester, your discomfort may increase as you and your baby continue to gain weight. You may have abdominal, leg, and back pain, sleeping problems, and an increased need to urinate.  During the third trimester your breasts will keep growing and they will continue to become tender. A yellow fluid (colostrum) may leak from your breasts. This is the first milk you are producing for your baby.  False labor is a condition in which you feel small, irregular tightenings of the muscles in the womb (contractions) that eventually go away. These are called Braxton Hicks contractions. Contractions may last for hours, days, or even weeks before true labor sets in.  Signs of labor can include: abdominal cramps; regular contractions that start at 10 minutes apart and become stronger and more frequent with time; watery or bloody mucus discharge that comes from the vagina; increased pelvic pressure and dull back pain; and leaking of amniotic fluid. This information is not intended to replace advice  given to you by your health care provider. Make sure you discuss any questions you have with your health care provider. Document Released: 02/17/2001 Document Revised: 08/01/2015 Document Reviewed: 04/26/2012 Elsevier Interactive Patient Education  2017 [employer]  

## 2016-08-13 ENCOUNTER — Telehealth: Payer: Self-pay

## 2016-08-13 NOTE — Telephone Encounter (Signed)
Pt is 36wks c/o cold/sinus/allergies.  What to take?  Can she take Emergen-C?  Adv per AMS Emergen-C is okay.  Also per protocol plain robitussin, plain sudafed, e.s. Tylenol, warm salt water gargles, Hall's cough drops, sucrets, sip hot beverages/soup, push fluids and rest.

## 2016-08-21 ENCOUNTER — Ambulatory Visit (INDEPENDENT_AMBULATORY_CARE_PROVIDER_SITE_OTHER): Payer: Medicaid Other | Admitting: Obstetrics & Gynecology

## 2016-08-21 VITALS — BP 112/70 | Wt 220.0 lb

## 2016-08-21 DIAGNOSIS — Z3A37 37 weeks gestation of pregnancy: Secondary | ICD-10-CM

## 2016-08-21 DIAGNOSIS — Z98891 History of uterine scar from previous surgery: Secondary | ICD-10-CM

## 2016-08-21 DIAGNOSIS — Z3490 Encounter for supervision of normal pregnancy, unspecified, unspecified trimester: Secondary | ICD-10-CM

## 2016-08-21 NOTE — Progress Notes (Signed)
FBS <100, Most PPBS <120.  FMC, PNV, Labor sx's, GBS, plans CS and BTL

## 2016-08-22 NOTE — Addendum Note (Signed)
Addended by: Nadara MustardHARRIS, ROBERT P on: 08/22/2016 08:13 AM   Modules accepted: Kipp BroodSmartSet

## 2016-08-24 LAB — CULTURE, BETA STREP (GROUP B ONLY): STREP GP B CULTURE: POSITIVE — AB

## 2016-08-26 ENCOUNTER — Ambulatory Visit (INDEPENDENT_AMBULATORY_CARE_PROVIDER_SITE_OTHER): Payer: Medicaid Other | Admitting: Obstetrics & Gynecology

## 2016-08-26 VITALS — BP 120/80 | Wt 222.0 lb

## 2016-08-26 DIAGNOSIS — Z3A37 37 weeks gestation of pregnancy: Secondary | ICD-10-CM

## 2016-08-26 DIAGNOSIS — Z3490 Encounter for supervision of normal pregnancy, unspecified, unspecified trimester: Secondary | ICD-10-CM

## 2016-08-26 DIAGNOSIS — Z98891 History of uterine scar from previous surgery: Secondary | ICD-10-CM

## 2016-08-26 NOTE — Progress Notes (Signed)
GBS POS discussed CS discussed (and BTL) GDM controlled

## 2016-08-26 NOTE — Patient Instructions (Signed)

## 2016-08-27 ENCOUNTER — Encounter: Payer: Self-pay | Admitting: Obstetrics & Gynecology

## 2016-08-27 NOTE — Telephone Encounter (Signed)
Who can do this?

## 2016-09-02 ENCOUNTER — Encounter: Payer: No Typology Code available for payment source | Admitting: Obstetrics & Gynecology

## 2016-09-02 ENCOUNTER — Encounter
Admission: RE | Admit: 2016-09-02 | Discharge: 2016-09-02 | Disposition: A | Discharge status: Home / Self Care | Payer: Medicaid Other | Source: Ambulatory Visit | Attending: Obstetrics & Gynecology | Admitting: Obstetrics & Gynecology

## 2016-09-02 ENCOUNTER — Ambulatory Visit (INDEPENDENT_AMBULATORY_CARE_PROVIDER_SITE_OTHER): Payer: Medicaid Other | Admitting: Obstetrics & Gynecology

## 2016-09-02 ENCOUNTER — Encounter: Payer: Self-pay | Admitting: Obstetrics & Gynecology

## 2016-09-02 VITALS — BP 120/80 | HR 66 | Ht 65.0 in | Wt 221.0 lb

## 2016-09-02 DIAGNOSIS — Z98891 History of uterine scar from previous surgery: Secondary | ICD-10-CM

## 2016-09-02 DIAGNOSIS — Z3490 Encounter for supervision of normal pregnancy, unspecified, unspecified trimester: Secondary | ICD-10-CM

## 2016-09-02 HISTORY — DX: Adverse effect of unspecified anesthetic, initial encounter: T41.45XA

## 2016-09-02 HISTORY — DX: Other complications of anesthesia, initial encounter: T88.59XA

## 2016-09-02 LAB — CBC
HCT: 37.4 % (ref 35.0–47.0)
Hemoglobin: 12.9 g/dL (ref 12.0–16.0)
MCH: 29.6 pg (ref 26.0–34.0)
MCHC: 34.5 g/dL (ref 32.0–36.0)
MCV: 85.7 fL (ref 80.0–100.0)
PLATELETS: 243 10*3/uL (ref 150–440)
RBC: 4.37 MIL/uL (ref 3.80–5.20)
RDW: 13.8 % (ref 11.5–14.5)
WBC: 11 10*3/uL (ref 3.6–11.0)

## 2016-09-02 LAB — TYPE AND SCREEN
ABO/RH(D): O POS
ANTIBODY SCREEN: NEGATIVE
EXTEND SAMPLE REASON: UNDETERMINED

## 2016-09-02 MED ORDER — CEFOXITIN SODIUM-DEXTROSE 2-2.2 GM-% IV SOLR (PREMIX)
2.0000 g | INTRAVENOUS | Status: AC
  Administered 2016-09-03: 2000 mg via INTRAVENOUS
  Filled 2016-09-02 (×2): qty 50

## 2016-09-02 NOTE — Progress Notes (Signed)
PRE-OPERATIVE HISTORY AND PHYSICAL EXAM  HPI:  Emily Warren is a 41 y.o. [id number].  Patient's last menstrual period was 12/05/2015.  [id number] Estimated Date of Delivery: 09/10/16  She is being admitted for Elective repeat CS and BTL for sterility.  GDMA1 this pregnancy. TDaP UTD. GBS Pos.  PMHx: She  has a past medical history of Gestational diabetes. Also,  has a past surgical history that includes Cesarean section., family history includes Cancer in her mother; Depression in her mother; Diabetes in her maternal grandfather and mother; Urolithiasis in her brother and mother.,  reports that she has never smoked. She has never used smokeless tobacco. She reports that she does not drink alcohol or use drugs.  No outpatient prescriptions have been marked as taking for the 09/02/16 encounter (Office Visit) with Nadara MustardHarris, Robert P, MD.   Also, has No Known Allergies.  Review of Systems  Constitutional: Negative for chills, fever and malaise/fatigue.  HENT: Negative for congestion, sinus pain and sore throat.   Eyes: Negative for blurred vision and pain.  Respiratory: Negative for cough and wheezing.   Cardiovascular: Negative for chest pain and leg swelling.  Gastrointestinal: Negative for abdominal pain, constipation, diarrhea, heartburn, nausea and vomiting.  Genitourinary: Negative for dysuria, frequency, hematuria and urgency.  Musculoskeletal: Negative for back pain, joint pain, myalgias and neck pain.  Skin: Negative for itching and rash.  Neurological: Negative for dizziness, tremors and weakness.  Endo/Heme/Allergies: Does not bruise/bleed easily.  Psychiatric/Behavioral: Negative for depression. The patient is not nervous/anxious and does not have insomnia.     Objective: BP 120/80   Pulse 66   Ht 5\' 5"  (1.651 m)   Wt 221 lb (100.2 kg)   LMP 12/05/2015   BMI 36.78 kg/m  Filed Weights   09/02/16 0846  Weight: 221 lb (100.2 kg)   Physical Exam  Vitals reviewed. Physical  examination Constitutional NAD, Conversant  Skin No rashes, lesions or ulceration. Normal palpated skin turgor. No nodularity.  Lungs: Clear to auscultation.No rales or wheezes. Normal Respiratory effort, no retractions.  Heart: NSR.  No murmurs or rubs appreciated. No periferal edema  Abdomen: Gravid.  Non-tender.  No masses.  No HSM. No hernia  Extremities: Moves all appropriately.  Normal ROM for age. No lymphadenopathy.  Neuro: Grossly intact  Psych: Oriented to PPT.  Normal mood. Normal affect.     Pelvic:   Vulva: Normal appearance.  No lesions.  Vagina: No lesions or abnormalities noted.  Urethra No masses tenderness or scarring.  Meatus Normal size without lesions or prolapse.  Cervix: closed  Perineum: Normal exam.  No lesions.        Bimanual   Uterus: Enlarged.  Non-tender.    Adnexae: Not palpated.  Cul-de-sac: Negative for abnormality.      OB History  Gravida Para Term Preterm AB Living  3 2 2     2   SAB TAB Ectopic Multiple Live Births          2    # Outcome Date GA Lbr Len/2nd Weight Sex Delivery Anes PTL Lv  3 Current           2 Term 09/12/08   8 lb 3 oz (3.714 kg) M CS-LTranv   LIV  1 Term 03/31/05   8 lb 12 oz (3.969 kg) M CS-LTranv   LIV    Patient denies any other pertinent gynecologic issues. See prenatal record for more complete H&P  Assessment: 1. History of  cesarean delivery   2. Encounter for supervision of low-risk pregnancy, antepartum   3.      Desire for sterilization  PLAN: 1.  Cesarean Delivery as Scheduled.  Bilateral Tubal Ligation  Patient will undergo surgical management with Cesarean Section.   The risks of surgery were discussed in detail with the patient including but not limited to: bleeding which may require transfusion or reoperation; infection which may require antibiotics; injury to surrounding organs which may involve bowel, bladder, ureters ; need for additional procedures including laparoscopy or laparotomy;  thromboembolic phenomenon, surgical site problems and other postoperative/anesthesia complications. Likelihood of success in alleviating the patient's condition was discussed. Routine postoperative instructions will be reviewed with the patient and her family in detail after surgery.  The patient concurred with the proposed plan, giving informed written consent for the surgery.  Patient will be NPO procedure.  Preoperative prophylactic antibiotics, as necessary, and SCDs ordered on call to the OR.  The patient has been fully informed about all methods of contraception, both temporary and permanent. She understands that tubal ligation is meant to be permanent, absolute and irreversible. She was told that there is an approximately 1 in 400 chance of a pregnancy in the future after tubal ligation. She was told the short and long term complications of tubal ligation. She understands the risks from this surgery include, but are not limited to, the risks of anesthesia, hemorrhage, infection, perforation, and injury to adjacent structures, bowel, bladder and blood vessels.   Annamarie Major, M.D. 09/02/2016 8:57 AM

## 2016-09-02 NOTE — Patient Instructions (Signed)
  Your procedure is scheduled on:Tomorrow July 28 , 2018. Report to Emergency Room at 5:30 am and they with take [facility] to [facility].  Remember: Instructions that are not followed completely may result in serious medical risk, up to and including death, or upon the discretion of your surgeon and anesthesiologist your surgery may need to be rescheduled.    _x___ 1. Do not eat food or drink liquids after midnight. No gum chewing or hard candies.     ____ 2. No Alcohol for 24 hours before or after surgery.   ____ 3. Bring all medications with you on the day of surgery if instructed.    __x__ 4. Notify your doctor if there is any change in your medical condition     (cold, fever, infections).    _____ 5. No smoking 24 hours prior to surgery.     Do not wear jewelry, make-up, hairpins, clips or nail polish.  Do not wear lotions, powders, or perfumes.   Do not shave 48 hours prior to surgery. Men may shave face and neck.  Do not bring valuables to the hospital.    [facility] is not responsible for any belongings or valuables.               Contacts, dentures or bridgework may not be worn into surgery.  Leave your suitcase in the car. After surgery it may be brought to your room.  For patients admitted to the hospital, discharge time is determined by your treatment team.   Patients discharged the day of surgery will not be allowed to drive home.    Please read over the following fact sheets that you were given:   [facility]  ____ Take these medicines the morning of surgery with A SIP OF WATER: NONE  ____ Fleet Enema (as directed)   _X___ Use CHG Soap as directed on instruction sheet  ____ Use inhalers on the day of surgery and bring to hospital day of surgery  ____ Stop metformin 2 days prior to surgery    ____ Take 1/2 of usual insulin dose the night before surgery and none on the morning of surgery.   ____ Stop Coumadin/Plavix/aspirin on does not  apply.  ____ Stop Anti-inflammatories such as Advil, Aleve, Ibuprofen, Motrin, Naproxen,  Naprosyn, Goodies powders or aspirin  products. OK to take Tylenol.   ____ Stop supplements until after surgery.    ____ Bring C-Pap to the hospital.

## 2016-09-02 NOTE — Patient Instructions (Signed)

## 2016-09-03 ENCOUNTER — Encounter
Admission: RE | Disposition: A | Discharge status: Home / Self Care | Payer: Self-pay | Source: Ambulatory Visit | Attending: Obstetrics & Gynecology

## 2016-09-03 ENCOUNTER — Inpatient Hospital Stay: Payer: Medicaid Other | Admitting: Anesthesiology

## 2016-09-03 ENCOUNTER — Inpatient Hospital Stay
Admission: RE | Admit: 2016-09-03 | Discharge: 2016-09-05 | DRG: 766 | Disposition: A | Discharge status: Home / Self Care | Payer: Medicaid Other | Source: Ambulatory Visit | Attending: Obstetrics & Gynecology | Admitting: Obstetrics & Gynecology

## 2016-09-03 DIAGNOSIS — Z6836 Body mass index (BMI) 36.0-36.9, adult: Secondary | ICD-10-CM | POA: Diagnosis not present

## 2016-09-03 DIAGNOSIS — Z3A39 39 weeks gestation of pregnancy: Secondary | ICD-10-CM

## 2016-09-03 DIAGNOSIS — Z302 Encounter for sterilization: Secondary | ICD-10-CM | POA: Diagnosis not present

## 2016-09-03 DIAGNOSIS — Z87891 Personal history of nicotine dependence: Secondary | ICD-10-CM | POA: Diagnosis not present

## 2016-09-03 DIAGNOSIS — O34211 Maternal care for low transverse scar from previous cesarean delivery: Principal | ICD-10-CM | POA: Diagnosis present

## 2016-09-03 DIAGNOSIS — E669 Obesity, unspecified: Secondary | ICD-10-CM | POA: Diagnosis present

## 2016-09-03 DIAGNOSIS — O99214 Obesity complicating childbirth: Secondary | ICD-10-CM | POA: Diagnosis present

## 2016-09-03 DIAGNOSIS — Z98891 History of uterine scar from previous surgery: Secondary | ICD-10-CM

## 2016-09-03 LAB — ABO/RH: ABO/RH(D): O POS

## 2016-09-03 LAB — GLUCOSE, CAPILLARY: Glucose-Capillary: 81 mg/dL (ref 65–99)

## 2016-09-03 LAB — RPR: RPR: NONREACTIVE

## 2016-09-03 SURGERY — Surgical Case
Anesthesia: Spinal

## 2016-09-03 MED ORDER — NALOXONE HCL 0.4 MG/ML IJ SOLN: 0.4000 mg | INTRAMUSCULAR | Status: DC | PRN

## 2016-09-03 MED ORDER — NALBUPHINE HCL 10 MG/ML IJ SOLN
5.0000 mg | INTRAMUSCULAR | Status: DC | PRN
  Administered 2016-09-03: 5 mg via INTRAVENOUS
  Filled 2016-09-03: qty 1

## 2016-09-03 MED ORDER — MORPHINE SULFATE (PF) 0.5 MG/ML IJ SOLN
INTRAMUSCULAR | Status: AC
  Filled 2016-09-03: qty 10

## 2016-09-03 MED ORDER — LACTATED RINGERS IV SOLN
INTRAVENOUS | Status: DC
  Administered 2016-09-03: 07:00:00 via INTRAVENOUS

## 2016-09-03 MED ORDER — BUPIVACAINE 0.25 % ON-Q PUMP DUAL CATH 400 ML: 400.0000 mL | INJECTION | Status: DC

## 2016-09-03 MED ORDER — ZOLPIDEM TARTRATE 5 MG PO TABS: 5.0000 mg | ORAL_TABLET | Freq: Every evening | ORAL | Status: DC | PRN

## 2016-09-03 MED ORDER — LACTATED RINGERS IV SOLN
INTRAVENOUS | Status: DC
  Administered 2016-09-03 – 2016-09-04 (×2): via INTRAVENOUS

## 2016-09-03 MED ORDER — FENTANYL CITRATE (PF) 100 MCG/2ML IJ SOLN: 25.0000 ug | INTRAMUSCULAR | Status: DC | PRN

## 2016-09-03 MED ORDER — BUPIVACAINE HCL (PF) 0.5 % IJ SOLN
INTRAMUSCULAR | Status: DC | PRN
  Administered 2016-09-03: 10 mL

## 2016-09-03 MED ORDER — OXYCODONE-ACETAMINOPHEN 5-325 MG PO TABS
2.0000 | ORAL_TABLET | ORAL | Status: DC | PRN
  Administered 2016-09-03: 2 via ORAL
  Filled 2016-09-03: qty 2

## 2016-09-03 MED ORDER — BUPIVACAINE HCL (PF) 0.5 % IJ SOLN: 10.0000 mL | Freq: Once | INTRAMUSCULAR | Status: DC

## 2016-09-03 MED ORDER — MORPHINE SULFATE (PF) 0.5 MG/ML IJ SOLN
INTRAMUSCULAR | Status: DC | PRN
  Administered 2016-09-03: .1 mg via EPIDURAL

## 2016-09-03 MED ORDER — COCONUT OIL OIL: 1.0000 "application " | TOPICAL_OIL | Status: DC | PRN

## 2016-09-03 MED ORDER — MENTHOL 3 MG MT LOZG
1.0000 | LOZENGE | OROMUCOSAL | Status: DC | PRN
  Filled 2016-09-03: qty 9

## 2016-09-03 MED ORDER — ONDANSETRON HCL 4 MG/2ML IJ SOLN: 4.0000 mg | Freq: Three times a day (TID) | INTRAMUSCULAR | Status: DC | PRN

## 2016-09-03 MED ORDER — WITCH HAZEL-GLYCERIN EX PADS: 1.0000 "application " | MEDICATED_PAD | CUTANEOUS | Status: DC | PRN

## 2016-09-03 MED ORDER — BUPIVACAINE HCL (PF) 0.5 % IJ SOLN
10.0000 mL | Freq: Once | INTRAMUSCULAR | Status: DC
  Filled 2016-09-03: qty 30

## 2016-09-03 MED ORDER — SIMETHICONE 80 MG PO CHEW: 80.0000 mg | CHEWABLE_TABLET | ORAL | Status: DC | PRN

## 2016-09-03 MED ORDER — NALBUPHINE HCL 10 MG/ML IJ SOLN: 5.0000 mg | Freq: Once | INTRAMUSCULAR | Status: DC | PRN

## 2016-09-03 MED ORDER — DIPHENHYDRAMINE HCL 50 MG/ML IJ SOLN
12.5000 mg | INTRAMUSCULAR | Status: DC | PRN
  Administered 2016-09-03: 12.5 mg via INTRAVENOUS
  Filled 2016-09-03: qty 1

## 2016-09-03 MED ORDER — KETOROLAC TROMETHAMINE 30 MG/ML IJ SOLN: 30.0000 mg | Freq: Four times a day (QID) | INTRAMUSCULAR | Status: DC | PRN

## 2016-09-03 MED ORDER — OXYCODONE-ACETAMINOPHEN 5-325 MG PO TABS
1.0000 | ORAL_TABLET | ORAL | Status: DC | PRN
  Administered 2016-09-04 – 2016-09-05 (×4): 1 via ORAL
  Filled 2016-09-03 (×4): qty 1

## 2016-09-03 MED ORDER — DIBUCAINE 1 % RE OINT: 1.0000 "application " | TOPICAL_OINTMENT | RECTAL | Status: DC | PRN

## 2016-09-03 MED ORDER — DIPHENHYDRAMINE HCL 25 MG PO CAPS: 25.0000 mg | ORAL_CAPSULE | ORAL | Status: DC | PRN

## 2016-09-03 MED ORDER — SODIUM CHLORIDE 0.9% FLUSH: 3.0000 mL | INTRAVENOUS | Status: DC | PRN

## 2016-09-03 MED ORDER — TETANUS-DIPHTH-ACELL PERTUSSIS 5-2.5-18.5 LF-MCG/0.5 IM SUSP: 0.5000 mL | Freq: Once | INTRAMUSCULAR | Status: DC

## 2016-09-03 MED ORDER — PRENATAL PLUS 27-1 MG PO TABS
1.0000 | ORAL_TABLET | Freq: Every day | ORAL | Status: DC
  Administered 2016-09-04 – 2016-09-05 (×2): 1 via ORAL
  Filled 2016-09-03 (×4): qty 1

## 2016-09-03 MED ORDER — ONDANSETRON HCL 4 MG/2ML IJ SOLN: 4.0000 mg | Freq: Once | INTRAMUSCULAR | Status: DC | PRN

## 2016-09-03 MED ORDER — SIMETHICONE 80 MG PO CHEW
80.0000 mg | CHEWABLE_TABLET | Freq: Three times a day (TID) | ORAL | Status: DC
  Administered 2016-09-03 – 2016-09-05 (×6): 80 mg via ORAL
  Filled 2016-09-03 (×6): qty 1

## 2016-09-03 MED ORDER — NALOXONE HCL 2 MG/2ML IJ SOSY
1.0000 ug/kg/h | PREFILLED_SYRINGE | INTRAVENOUS | Status: DC | PRN
  Filled 2016-09-03: qty 2

## 2016-09-03 MED ORDER — SOD CITRATE-CITRIC ACID 500-334 MG/5ML PO SOLN
30.0000 mL | ORAL | Status: AC
  Administered 2016-09-03: 30 mL via ORAL
  Filled 2016-09-03: qty 15

## 2016-09-03 MED ORDER — SENNOSIDES-DOCUSATE SODIUM 8.6-50 MG PO TABS
2.0000 | ORAL_TABLET | ORAL | Status: DC
  Administered 2016-09-04 – 2016-09-05 (×2): 2 via ORAL
  Filled 2016-09-03 (×2): qty 2

## 2016-09-03 MED ORDER — FENTANYL CITRATE (PF) 100 MCG/2ML IJ SOLN
INTRAMUSCULAR | Status: AC
  Filled 2016-09-03: qty 2

## 2016-09-03 MED ORDER — KETOROLAC TROMETHAMINE 30 MG/ML IJ SOLN
30.0000 mg | Freq: Four times a day (QID) | INTRAMUSCULAR | Status: AC
  Administered 2016-09-03 – 2016-09-04 (×2): 30 mg via INTRAVENOUS
  Filled 2016-09-03 (×2): qty 1

## 2016-09-03 MED ORDER — ACETAMINOPHEN 500 MG PO TABS
1000.0000 mg | ORAL_TABLET | Freq: Four times a day (QID) | ORAL | Status: DC
  Administered 2016-09-03: 1000 mg via ORAL
  Filled 2016-09-03: qty 2

## 2016-09-03 MED ORDER — SIMETHICONE 80 MG PO CHEW
80.0000 mg | CHEWABLE_TABLET | ORAL | Status: DC
  Administered 2016-09-05: 80 mg via ORAL
  Filled 2016-09-03: qty 1

## 2016-09-03 MED ORDER — OXYTOCIN 40 UNITS IN LACTATED RINGERS INFUSION - SIMPLE MED
INTRAVENOUS | Status: DC | PRN
  Administered 2016-09-03: 09:00:00
  Administered 2016-09-03: 1000 mL via INTRAVENOUS

## 2016-09-03 MED ORDER — ACETAMINOPHEN 325 MG PO TABS: 650.0000 mg | ORAL_TABLET | ORAL | Status: DC | PRN

## 2016-09-03 MED ORDER — BUPIVACAINE 0.25 % ON-Q PUMP DUAL CATH 400 ML
400.0000 mL | INJECTION | Status: DC
  Filled 2016-09-03: qty 400

## 2016-09-03 MED ORDER — NALBUPHINE HCL 10 MG/ML IJ SOLN: 5.0000 mg | INTRAMUSCULAR | Status: DC | PRN

## 2016-09-03 MED ORDER — PHENYLEPHRINE HCL 10 MG/ML IJ SOLN
INTRAMUSCULAR | Status: DC | PRN
  Administered 2016-09-03: 100 ug via INTRAVENOUS

## 2016-09-03 MED ORDER — MEPERIDINE HCL 25 MG/ML IJ SOLN: 6.2500 mg | INTRAMUSCULAR | Status: DC | PRN

## 2016-09-03 MED ORDER — PHENYLEPHRINE HCL 10 MG/ML IJ SOLN
INTRAMUSCULAR | Status: AC
  Filled 2016-09-03: qty 1

## 2016-09-03 MED ORDER — MORPHINE SULFATE (PF) 2 MG/ML IV SOLN: 1.0000 mg | INTRAVENOUS | Status: DC | PRN

## 2016-09-03 MED ORDER — OXYTOCIN 40 UNITS IN LACTATED RINGERS INFUSION - SIMPLE MED
INTRAVENOUS | Status: AC
  Filled 2016-09-03: qty 1000

## 2016-09-03 MED ORDER — OXYTOCIN 40 UNITS IN LACTATED RINGERS INFUSION - SIMPLE MED: 2.5000 [IU]/h | INTRAVENOUS | Status: AC

## 2016-09-03 MED ORDER — DIPHENHYDRAMINE HCL 25 MG PO CAPS: 25.0000 mg | ORAL_CAPSULE | Freq: Four times a day (QID) | ORAL | Status: DC | PRN

## 2016-09-03 MED ORDER — FENTANYL CITRATE (PF) 100 MCG/2ML IJ SOLN
INTRAMUSCULAR | Status: DC | PRN
  Administered 2016-09-03: 10 ug via INTRAVENOUS

## 2016-09-03 SURGICAL SUPPLY — 23 items
CANISTER SUCT 3000ML PPV (MISCELLANEOUS) ×3 IMPLANT
CATH KIT ON-Q SILVERSOAK 5IN (CATHETERS) ×6 IMPLANT
CHLORAPREP W/TINT 26ML (MISCELLANEOUS) ×6 IMPLANT
DERMABOND ADVANCED (GAUZE/BANDAGES/DRESSINGS) ×2
DERMABOND ADVANCED .7 DNX12 (GAUZE/BANDAGES/DRESSINGS) ×1 IMPLANT
ELECT CAUTERY BLADE 6.4 (BLADE) IMPLANT
ELECT REM PT RETURN 9FT ADLT (ELECTROSURGICAL) ×3
ELECTRODE REM PT RTRN 9FT ADLT (ELECTROSURGICAL) ×1 IMPLANT
GLOVE SKINSENSE NS SZ8.0 LF (GLOVE) ×2
GLOVE SKINSENSE STRL SZ8.0 LF (GLOVE) ×1 IMPLANT
GOWN STRL REUS W/ TWL LRG LVL3 (GOWN DISPOSABLE) ×1 IMPLANT
GOWN STRL REUS W/ TWL XL LVL3 (GOWN DISPOSABLE) ×2 IMPLANT
GOWN STRL REUS W/TWL LRG LVL3 (GOWN DISPOSABLE) ×2
GOWN STRL REUS W/TWL XL LVL3 (GOWN DISPOSABLE) ×4
NS IRRIG 1000ML POUR BTL (IV SOLUTION) ×3 IMPLANT
PACK C SECTION AR (MISCELLANEOUS) ×3 IMPLANT
PAD OB MATERNITY 4.3X12.25 (PERSONAL CARE ITEMS) ×3 IMPLANT
PAD PREP 24X41 OB/GYN DISP (PERSONAL CARE ITEMS) ×3 IMPLANT
SUT MAXON ABS #0 GS21 30IN (SUTURE) ×6 IMPLANT
SUT PLAIN 2 0 XLH (SUTURE) ×3 IMPLANT
SUT VIC AB 1 CT1 36 (SUTURE) ×9 IMPLANT
SUT VIC AB 2-0 CT1 36 (SUTURE) ×3 IMPLANT
SUT VIC AB 4-0 FS2 27 (SUTURE) ×3 IMPLANT

## 2016-09-03 NOTE — Anesthesia Post-op Follow-up Note (Cosign Needed)
Anesthesia QCDR form completed.        

## 2016-09-03 NOTE — Transfer of Care (Signed)
Immediate Anesthesia Transfer of Care Note  Patient: Emily HotterShannon M Direnzo  Procedure(s) Performed: Procedure(s): CESAREAN SECTION WITH BILATERAL TUBAL LIGATION (N/A)  Patient Location: PACU and Mother/Baby  Anesthesia Type:Spinal  Level of Consciousness: awake, alert  and oriented  Airway & Oxygen Therapy: Patient Spontanous Breathing  Post-op Assessment: Report given to RN and Post -op Vital signs reviewed and stable  Post vital signs: Reviewed and stable  Last Vitals:  Vitals:   09/03/16 0915 09/03/16 0917  BP: 124/70 125/76  Pulse: 66 75  Resp: 16   Temp:      Last Pain:  Vitals:   09/03/16 0915  TempSrc:   PainSc: 0-No pain         Complications: No apparent anesthesia complications

## 2016-09-03 NOTE — Op Note (Signed)
Cesarean Section Procedure Note Indications: prior cesarean section and term intrauterine pregnancy, Desire for permanent sterility  Pre-operative Diagnosis: Intrauterine pregnancy 4216w0d ;  prior cesarean section and term intrauterine pregnancy, Desire for permanent sterility Post-operative Diagnosis: same, delivered. Procedure: Low Transverse Cesarean Section, Bilateral Tubal Ligation Surgeon: Annamarie MajorPaul Harris, MD Assistant(s): Dr Jean RosenthalJackson Anesthesia: Spinal anesthesia Estimated Blood Loss:300  Complications: None; patient tolerated the procedure well. Disposition: PACU - hemodynamically stable. Condition: stable  Findings: A female infant in the cephalic presentation. Amniotic fluid - Clear  Birth weight 7-4 lbs.  Apgars of 9 and 9.  Intact placenta with a three-vessel cord. Grossly normal uterus, tubes and ovaries bilaterally. Moderate intraabdominal adhesions were noted over the lower uterine segment to include bladder.  Thin LUS as well..  Procedure Details   The patient was taken to Operating Room, identified as the correct patient and the procedure verified as C-Section Delivery. A Time Out was held and the above information confirmed. After induction of anesthesia, the patient was draped and prepped in the usual sterile manner. A Pfannenstiel incision was made and carried down through the subcutaneous tissue to the fascia. Fascial incision was made and extended transversely with the Mayo scissors. The fascia was separated from the underlying rectus tissue superiorly and inferiorly. The peritoneum was identified and entered bluntly. Peritoneal incision was extended longitudinally. The utero-vesical peritoneal reflection was incised transversely and a bladder flap was created digitally.  A low transverse hysterotomy was made. The fetus was delivered atraumatically. The umbilical cord was clamped x2 and cut and the infant was handed to the awaiting pediatricians. The placenta was removed intact  and appeared normal with a 3-vessel cord.  The uterus was exteriorized and cleared of all clot and debris. The hysterotomy was closed with running sutures of 0 Vicryl suture. A second imbricating layer was placed with the same suture. Excellent hemostasis was observed.   The left Fallopian tube was identified, grasped with the Babcock clamps, lifted to the skin incision and followed out distally to the fimbriae. An avascular midsection of the tube approximately 3-4cm from the cornua was grasped with the babcock clamps and brought into a knuckle at the skin incision. The tube was double ligated with 2-0 Vicryl suture and the intervening portion of tube was transected and removed. Excellent hemostasis was noted and the tube was returned to the abdomen. Attention was then turned to the right fallopian tube after confirmation of identification by tracing the tube out to the fimbriae. The same procedure was then performed on the right Fallopian tube. Again, excellent hemostasis was noted at the end of the procedure.  The uterus was returned to the abdomen. The pelvis was irrigated and again, excellent hemostasis was noted. Interceed was placed over the incision to minimize adhesion formation.  The On Q Pain pump System was then placed.  Trocars were placed through the abdominal wall into the subfascial space and these were used to thread the silver soaker cathaters into place.The rectus fascia was then reapproximated with running sutures of Maxon, with careful placement not to incorporate the cathaters. Subcutaneous tissues are then irrigated with saline and hemostasis assured.  Skin is then closed with 4-0 vicryl suture in a subcuticular fashion followed by skin adhesive. The cathaters are flushed each with 5 mL of Bupivicaine and stabilized into place with dressing. Instrument, sponge, and needle counts were correct prior to the abdominal closure and at the conclusion of the case.  The patient tolerated the  procedure well and was  transferred to the recovery room in stable condition.   Annamarie Major, MD, [facility], [facility] 09/03/2016  8:56 AM

## 2016-09-03 NOTE — Discharge Instructions (Signed)

## 2016-09-03 NOTE — Plan of Care (Signed)
Problem: Education: Goal: Knowledge of condition will improve Outcome: Progressing Pt placed infant skin to skin and fed infant

## 2016-09-03 NOTE — H&P (Signed)
History and Physical Interval Note:  09/03/2016 7:24 AM  Emily Warren  has presented today for surgery, with the diagnosis of history of cesarean delivery,obesity in pregnancy,desire for permanent sterility  The various methods of treatment have been discussed with the patient and family. After consideration of risks, benefits and other options for treatment, the patient has consented to  Procedure(s): CESAREAN SECTION WITH BILATERAL TUBAL LIGATION (N/A) as a surgical intervention .  The patient's history has been reviewed, patient examined, no change in status, stable for surgery.  Pt has the following beta blocker history-  Not taking Beta Blocker.  I have reviewed the patient's chart and labs.  Questions were answered to the patient's satisfaction.    Letitia Libraobert Paul Harris

## 2016-09-03 NOTE — Anesthesia Preprocedure Evaluation (Signed)
Anesthesia Evaluation  Patient identified by MRN, date of birth, ID band Patient awake    Reviewed: Allergy & Precautions, H&P , NPO status , Patient's Chart, lab work & pertinent test results, reviewed documented beta blocker date and time   History of Anesthesia Complications (+) PONV and history of anesthetic complications  Airway Mallampati: II  TM Distance: >3 FB Neck ROM: full    Dental no notable dental hx. (+) Teeth Intact   Pulmonary neg pulmonary ROS, Current Smoker, former smoker,    Pulmonary exam normal breath sounds clear to auscultation       Cardiovascular Exercise Tolerance: Good negative cardio ROS   Rhythm:regular Rate:Normal     Neuro/Psych negative neurological ROS  negative psych ROS   GI/Hepatic negative GI ROS, Neg liver ROS,   Endo/Other  negative endocrine ROSdiabetes  Renal/GU      Musculoskeletal   Abdominal   Peds  Hematology negative hematology ROS (+)   Anesthesia Other Findings   Reproductive/Obstetrics (+) Pregnancy                             Anesthesia Physical Anesthesia Plan  ASA: II  Anesthesia Plan: Spinal   Post-op Pain Management:    Induction:   PONV Risk Score and Plan:   Airway Management Planned:   Additional Equipment:   Intra-op Plan: Delibrate Circulatory arrest per surgeon request  Post-operative Plan:   Informed Consent: I have reviewed the patients History and Physical, chart, labs and discussed the procedure including the risks, benefits and alternatives for the proposed anesthesia with the patient or authorized representative who has indicated his/her understanding and acceptance.     Plan Discussed with:   Anesthesia Plan Comments: (Discussed issues with itching and intrathecal narcotics and pt prefers to have them added. JA)        Anesthesia Quick Evaluation

## 2016-09-03 NOTE — Discharge Summary (Signed)
OB Discharge Summary     Patient Name: RAMIA SIDNEY DOB: 1975-06-16 MRN: [id number]  Date of admission: 09/03/2016 Delivering MD: Hoyt Koch, MD  Date of Delivery: 09/03/2016  Date of discharge: 09/05/2016  Admitting diagnosis: history of cesarean delivery,obesity in pregnancy,desire for permanent sterility Intrauterine pregnancy: [redacted]w[redacted]d    Secondary diagnosis: None     Discharge diagnosis: Term Pregnancy Delivered and desire for sterility and prior h/o cesareasn section                                                                                                Post partum procedures:none  Augmentation: none  Complications: None  Hospital course:  Sceduled C/S   41y.o. yo G3P3003 at 342w0das admitted to the hospital 09/03/2016 for scheduled cesarean section with the following indication:Elective Repeat.  Membrane Rupture Time/Date:   ,    Patient delivered a Viable infant.09/03/2016  Details of operation can be found in separate operative note.  Pateint had an uncomplicated postpartum course.  She is ambulating, tolerating a regular diet, passing flatus, and urinating well. Patient is discharged home in stable condition on  09/05/16         Physical exam  Vitals:   09/04/16 1612 09/05/16 0025 09/05/16 0427 09/05/16 0740  BP:  108/61  121/78  Pulse:  72  66  Resp:  20  18  Temp: 98.4 F (36.9 C) 98.4 F (36.9 C) 98 F (36.7 C) 98.4 F (36.9 C)  TempSrc: Oral Oral Oral Oral  SpO2:  98%  97%  Weight:      Height:       General: alert, cooperative and no distress Lochia: appropriate Uterine Fundus: firm Incision: Healing well with no significant drainage, Dressing is clean, dry, and intact DVT Evaluation: No evidence of DVT seen on physical exam. No cords or calf tenderness. No significant calf/ankle edema.  Labs: Lab Results  Component Value Date   WBC 13.6 (H) 09/04/2016   HGB 11.4 (L) 09/04/2016   HCT 33.1 (L) 09/04/2016   MCV 86.0 09/04/2016   PLT 216 09/04/2016   CMP Latest Ref Rng & Units 09/04/2016  Glucose 65 - 99 mg/dL 79  BUN 6 - 20 mg/dL -  Creatinine 0.44 - 1.00 mg/dL -  Sodium 135 - 145 mmol/L -  Potassium 3.5 - 5.1 mmol/L -  Chloride 101 - 111 mmol/L -  CO2 22 - 32 mmol/L -  Calcium 8.9 - 10.3 mg/dL -  Total Protein 6.5 - 8.1 g/dL -  Total Bilirubin 0.3 - 1.2 mg/dL -  Alkaline Phos 38 - 126 U/L -  AST 15 - 41 U/L -  ALT 14 - 54 U/L -    Discharge instruction: per After Visit Summary.  Medications:  Allergies as of 09/05/2016   No Known Allergies     Medication List    STOP taking these medications   ACCU-CHEK FASTCLIX LANCETS Misc   ACCU-CHEK NANO SMARTVIEW w/Device Kit   glucose blood test strip Commonly known as:  ACCU-CHEK SMARTVIEW     TAKE these medications  flintstones complete 60 MG chewable tablet Chew 2 tablets by mouth daily.   ibuprofen 600 MG tablet Commonly known as:  ADVIL,MOTRIN Take 1 tablet (600 mg total) by mouth every 6 (six) hours as needed for fever or headache.   oxyCODONE-acetaminophen 5-325 MG tablet Commonly known as:  PERCOCET/ROXICET Take 2 tablets by mouth every 6 (six) hours as needed (breakthrough pain).       Diet: routine diet  Activity: Advance as tolerated. Pelvic rest for 6 weeks.   Outpatient follow up: Follow-up Information    Gae Dry, MD. Go in 2 week(s).   Specialty:  Obstetrics and Gynecology Contact information: [address] [place] [address] [phone]             Postpartum contraception: Tubal Ligation Rhogam Given postpartum: no Rubella vaccine given postpartum: no Varicella vaccine given postpartum: no TDaP given antepartum or postpartum: done  Newborn Data: Live born female  Birth Weight: 7 lb 3.7 oz (3280 g) APGAR: 9, 9   Baby Feeding: Breast  Disposition:home with mother  SIGNED: Prentice Docker, MD 09/05/2016 9:24 AM

## 2016-09-04 LAB — CBC
HCT: 33.1 % — ABNORMAL LOW (ref 35.0–47.0)
Hemoglobin: 11.4 g/dL — ABNORMAL LOW (ref 12.0–16.0)
MCH: 29.5 pg (ref 26.0–34.0)
MCHC: 34.3 g/dL (ref 32.0–36.0)
MCV: 86 fL (ref 80.0–100.0)
Platelets: 216 10*3/uL (ref 150–440)
RBC: 3.85 MIL/uL (ref 3.80–5.20)
RDW: 14 % (ref 11.5–14.5)
WBC: 13.6 10*3/uL — ABNORMAL HIGH (ref 3.6–11.0)

## 2016-09-04 LAB — SURGICAL PATHOLOGY

## 2016-09-04 LAB — GLUCOSE, RANDOM: Glucose, Bld: 79 mg/dL (ref 65–99)

## 2016-09-04 MED ORDER — IBUPROFEN 600 MG PO TABS
600.0000 mg | ORAL_TABLET | Freq: Four times a day (QID) | ORAL | Status: DC | PRN
  Administered 2016-09-04 – 2016-09-05 (×4): 600 mg via ORAL
  Filled 2016-09-04 (×4): qty 1

## 2016-09-04 NOTE — Lactation Note (Signed)
This note was copied from a baby's chart. Lactation Consultation Note  Patient Name: Emily Warren ZOXWR'UToday's Date: 09/04/2016 Reason for consult: Follow-up assessment   Maternal Data Formula Feeding for Exclusion: No Has patient been taught Hand Expression?: Yes Does the patient have breastfeeding experience prior to this delivery?: Yes Mom has expressible colostrum Feeding Feeding Type: Breast Fed Length of feed: 15 min (fussy gassy) Baby fussy and having difficulty coordinating suck, did well once breast sandwiched and held in mouth on tongue, swallows heard LATCH Score/Interventions Latch: Grasps breast easily, tongue down, lips flanged, rhythmical sucking. Intervention(s): Adjust position;Assist with latch;Breast compression (fussy and needs help coordinating suck)  Audible Swallowing: Spontaneous and intermittent Intervention(s): Skin to skin;Hand expression  Type of Nipple: Everted at rest and after stimulation  Comfort (Breast/Nipple): Soft / non-tender     Hold (Positioning): Assistance needed to correctly position infant at breast and maintain latch. Intervention(s): Breastfeeding basics reviewed;Position options  LATCH Score: 9  Lactation Tools Discussed/Used  Do not recommend supplementing with formula at this time, mom able to express colostrum, swallows heard at breast, encouraged mom to breastfeed frequently, may pump and/or hand express to give supplement later today if needed and baby not nursing well    Consult Status Consult Status: Follow-up Date: 09/04/16 Follow-up type: In-patient    Dyann KiefMarsha D Khalaya Mcgurn 09/04/2016, 11:59 AM

## 2016-09-04 NOTE — Anesthesia Postprocedure Evaluation (Signed)
Anesthesia Post Note  Patient: Genia HotterShannon M Donlon  Procedure(s) Performed: Procedure(s) (LRB): CESAREAN SECTION WITH BILATERAL TUBAL LIGATION (N/A)  Patient location during evaluation: Mother Baby Anesthesia Type: Spinal Level of consciousness: awake, awake and alert and oriented Pain management: pain level controlled Vital Signs Assessment: post-procedure vital signs reviewed and stable Respiratory status: spontaneous breathing, nonlabored ventilation and respiratory function stable Cardiovascular status: stable Postop Assessment: no headache, no backache, patient able to bend at knees, no signs of nausea or vomiting and adequate PO intake Anesthetic complications: no     Last Vitals:  Vitals:   09/03/16 1944 09/04/16 0414  BP: 116/69 106/66  Pulse: 77 68  Resp: 20 20  Temp: 36.9 C 36.3 C    Last Pain:  Vitals:   09/04/16 0615  TempSrc:   PainSc: 3                  Shanin Szymanowski,  Camrie Stock R

## 2016-09-04 NOTE — Progress Notes (Signed)
  Subjective:  Doing well no concerns.  Pain well controlled.  Moderate lochia.  Tolerating general diet  Objective:  Blood pressure 114/71, pulse 73, temperature 98.4 F (36.9 C), temperature source Oral, resp. rate 18, height 5\' 5"  (1.651 m), weight 221 lb (100.2 kg), last menstrual period 12/05/2015, SpO2 98 %, unknown if currently breastfeeding.  General: NAD Pulmonary: no increased work of breathing Abdomen: non-distended, non-tender, fundus firm at level of umbilicus Incision:D/C/i Extremities: no edema, no erythema, no tenderness  Results for orders placed or performed during the hospital encounter of 09/03/16 (from the past 72 hour(s))  Glucose, capillary     Status: None   Collection Time: 09/03/16  7:18 AM  Result Value Ref Range   Glucose-Capillary 81 65 - 99 mg/dL  ABO/Rh     Status: None   Collection Time: 09/03/16  9:47 AM  Result Value Ref Range   ABO/RH(D) O POS   CBC     Status: Abnormal   Collection Time: 09/04/16  4:04 AM  Result Value Ref Range   WBC 13.6 (H) 3.6 - 11.0 K/uL   RBC 3.85 3.80 - 5.20 MIL/uL   Hemoglobin 11.4 (L) 12.0 - 16.0 g/dL   HCT 81.133.1 (L) 91.435.0 - 78.247.0 %   MCV 86.0 80.0 - 100.0 fL   MCH 29.5 26.0 - 34.0 pg   MCHC 34.3 32.0 - 36.0 g/dL   RDW 95.614.0 21.311.5 - 08.614.5 %   Platelets 216 150 - 440 K/uL  Glucose, random     Status: None   Collection Time: 09/04/16  4:04 AM  Result Value Ref Range   Glucose, Bld 79 65 - 99 mg/dL     Assessment:   41 y.o. G3P3003 postoperativeday # 1 RLTCS & BTL   Plan:  1) Acute blood loss anemia - hemodynamically stable and asymptomatic - po ferrous sulfate  2) Blood Type --/--/O POS (06/28 [id number]) / Rubella Immune (11/28 0000)   3) TDAP status UTD  4) Breast/BTL  5) Disposition - anticipate POD2

## 2016-09-04 NOTE — Anesthesia Post-op Follow-up Note (Signed)
  Anesthesia Pain Follow-up Note  [facility]  Day #: 1  Date of Follow-up: 09/04/2016 Time: 7:14 AM  Last Vitals:  Vitals:   09/03/16 1944 09/04/16 0414  BP: 116/69 106/66  Pulse: 77 68  Resp: 20 20  Temp: 36.9 C 36.3 C    Level of Consciousness: alert  Pain: none   Side Effects:None  Catheter Site Exam:clean, dry, no drainage     Plan: Continue current therapy of postop epidural at surgeon's request  Darrelyn Morro,  Sheran FavaMark R

## 2016-09-05 MED ORDER — IBUPROFEN 600 MG PO TABS: 600.0000 mg | ORAL_TABLET | Freq: Four times a day (QID) | ORAL | 0 refills | Status: DC | PRN

## 2016-09-05 MED ORDER — OXYCODONE-ACETAMINOPHEN 5-325 MG PO TABS: 2.0000 | ORAL_TABLET | Freq: Four times a day (QID) | ORAL | 0 refills | Status: DC | PRN

## 2016-09-05 NOTE — Progress Notes (Signed)
D/C home to car via staff in wheelchair.

## 2016-09-05 NOTE — Progress Notes (Signed)
D/C instructions provided, pt states understanding, aware of follow up appt.  Prescription given to pt.   

## 2016-09-16 ENCOUNTER — Encounter: Payer: Self-pay | Admitting: Obstetrics & Gynecology

## 2016-09-16 ENCOUNTER — Ambulatory Visit (INDEPENDENT_AMBULATORY_CARE_PROVIDER_SITE_OTHER): Payer: Medicaid Other | Admitting: Obstetrics & Gynecology

## 2016-09-16 VITALS — BP 120/80 | HR 69 | Ht 65.0 in | Wt 197.0 lb

## 2016-09-16 DIAGNOSIS — Z98891 History of uterine scar from previous surgery: Secondary | ICD-10-CM

## 2016-09-16 DIAGNOSIS — Z9851 Tubal ligation status: Secondary | ICD-10-CM

## 2016-09-16 NOTE — Progress Notes (Signed)
  Postoperative Follow-up Patient presents post op from CS BTL for Repeat and Sterility, 2 weeks ago.  Subjective: Patient reports marked improvement in her preop symptoms. Eating a regular diet without difficulty. The patient is not having any pain.  Activity: normal activities of daily living. Patient reports vaginal sx's of None  Objective: BP 120/80   Pulse 69   Ht 5\' 5"  (1.651 m)   Wt 197 lb (89.4 kg)   LMP 12/05/2015   BMI 32.78 kg/m  Physical Exam  Constitutional: She is oriented to person, place, and time. She appears well-developed and well-nourished. No distress.  Cardiovascular: Normal rate.   Pulmonary/Chest: Effort normal.  Abdominal: Soft. She exhibits no distension. There is no tenderness.  Incision Healing Well   Musculoskeletal: Normal range of motion.  Neurological: She is alert and oriented to person, place, and time. No cranial nerve deficit.  Skin: Skin is warm and dry.  Psychiatric: She has a normal mood and affect.    Assessment: s/p :  cesarean section and tubal ligation stable  Plan: Patient has done well after surgery with no apparent complications.  I have discussed the post-operative course to date, and the expected progress moving forward.  The patient understands what complications to be concerned about.  I will see the patient in routine follow up, or sooner if needed.    Activity plan: No heavy lifting.Pelvic rest.  Emily Warren 09/16/2016, 11:18 AM

## 2016-10-15 ENCOUNTER — Ambulatory Visit (INDEPENDENT_AMBULATORY_CARE_PROVIDER_SITE_OTHER): Payer: Medicaid Other | Admitting: Obstetrics & Gynecology

## 2016-10-15 ENCOUNTER — Encounter: Payer: Self-pay | Admitting: Obstetrics & Gynecology

## 2016-10-15 VITALS — BP 126/86 | HR 80 | Wt 196.0 lb

## 2016-10-15 DIAGNOSIS — Z9851 Tubal ligation status: Secondary | ICD-10-CM

## 2016-10-15 DIAGNOSIS — Z8041 Family history of malignant neoplasm of ovary: Secondary | ICD-10-CM | POA: Insufficient documentation

## 2016-10-15 DIAGNOSIS — Z98891 History of uterine scar from previous surgery: Secondary | ICD-10-CM

## 2016-10-15 NOTE — Progress Notes (Signed)
  OBSTETRICS POSTPARTUM CLINIC PROGRESS NOTE  Subjective:     Emily Warren is a 41 y.o. G72P3003 female who presents for a postpartum visit. She is 6 weeks postpartum following a Term pregnancy and delivery by CS and BTL.  I have fully reviewed the prenatal and intrapartum course. Anesthesia: spinal.  Postpartum course has been complicated by uncomplicated.  Baby is feeding by Breast.  Bleeding: patient has not  resumed menses.  Bowel function is normal. Bladder function is normal.  Patient is not sexually active. Contraception method desired is tubal ligation.  Postpartum depression screening: negative. Edinburgh 1.  The following portions of the patient's history were reviewed and updated as appropriate: allergies, current medications, past family history, past medical history, past social history, past surgical history and problem list.  Review of Systems Pertinent items are noted in HPI.  Objective:    BP 126/86 (BP Location: Left Arm, Patient Position: Sitting, Cuff Size: Normal)   Pulse 80   Wt 196 lb (88.9 kg)   Breastfeeding? Yes   BMI 32.62 kg/m   General:  alert and no distress   Breasts:  inspection negative, no nipple discharge or bleeding, no masses or nodularity palpable  Lungs: clear to auscultation bilaterally  Heart:  regular rate and rhythm, S1, S2 normal, no murmur, click, rub or gallop  Abdomen: soft, non-tender; bowel sounds normal; no masses,  no organomegaly.  Well healed Pfannenstiel incision   Vulva:  normal  Vagina: normal vagina, no discharge, exudate, lesion, or erythema  Cervix:  no cervical motion tenderness and no lesions  Corpus: normal size, contour, position, consistency, mobility, non-tender  Adnexa:  normal adnexa and no mass, fullness, tenderness  Rectal Exam: Not performed.          Assessment:  Post Partum Care visit 1. History of cesarean delivery  2. History of tubal ligation  3. Family history of ovarian cancer Discuss BRCA  again at next visit (extensive discussion today).  Paternal Aunt 57s w Ovarian cancer recently.   Plan:  See orders and Patient Instructions Resume all normal activities Follow up in: 4 months or as needed.   Barnett Applebaum, MD, [facility], [facility] 10/15/2016  11:54 AM

## 2016-12-03 ENCOUNTER — Ambulatory Visit: Payer: Self-pay

## 2016-12-21 ENCOUNTER — Encounter: Payer: Self-pay | Admitting: Obstetrics & Gynecology

## 2016-12-21 ENCOUNTER — Ambulatory Visit (INDEPENDENT_AMBULATORY_CARE_PROVIDER_SITE_OTHER): Payer: Self-pay | Admitting: Obstetrics & Gynecology

## 2016-12-21 VITALS — BP 120/80 | HR 71 | Ht 65.0 in | Wt 201.0 lb

## 2016-12-21 DIAGNOSIS — M545 Low back pain, unspecified: Secondary | ICD-10-CM

## 2016-12-21 DIAGNOSIS — N200 Calculus of kidney: Secondary | ICD-10-CM

## 2016-12-21 LAB — POCT URINALYSIS DIPSTICK
BILIRUBIN UA: NEGATIVE
GLUCOSE UA: NEGATIVE
Ketones, UA: NEGATIVE
Leukocytes, UA: NEGATIVE
Nitrite, UA: NEGATIVE
Protein, UA: NEGATIVE
RBC UA: POSITIVE
SPEC GRAV UA: 1.02 (ref 1.010–1.025)
UROBILINOGEN UA: 0.2 U/dL
pH, UA: 6.5 (ref 5.0–8.0)

## 2016-12-21 NOTE — Progress Notes (Signed)
  HPI: [name] is a 41 y.o. [phone], presents today for a problem visit.    Urinary Tract Infection: Patient complains of pain in the right flank . She has had symptoms for 5 days. Patient also complains of back pain. Patient denies dysuria, hematuria, n/v/d/f/c.Marland Kitchen Patient does not have a history of recurrent UTI.  Patient does not have a history of pyelonephritis. No modifiers. No context. Moderate to severe pain at times.  PMHx: She  has a past medical history of Complication of anesthesia and Gestational diabetes. Also,  has a past surgical history that includes Cesarean section (2007 & 2010); Wisdom tooth extraction (1997); and Cesarean section with bilateral tubal ligation (N/A, 09/03/2016)., family history includes Cancer in her mother; Depression in her mother; Diabetes in her maternal grandfather and mother; Urolithiasis in her brother and mother.,  reports that she quit smoking about 13 years ago. She has never used smokeless tobacco. She reports that she does not drink alcohol or use drugs.  She has a current medication list which includes the following prescription(s): ibuprofen, flintstones complete, and oxycodone-acetaminophen, and the following Facility-Administered Medications: bupivacaine and bupivacaine hcl. Also, has No Known Allergies.  Review of Systems  Constitutional: Negative for chills, fever and malaise/fatigue.  HENT: Negative for congestion, sinus pain and sore throat.   Eyes: Negative for blurred vision and pain.  Respiratory: Negative for cough and wheezing.   Cardiovascular: Negative for chest pain and leg swelling.  Gastrointestinal: Negative for abdominal pain, constipation, diarrhea, heartburn, nausea and vomiting.  Genitourinary: Negative for dysuria, frequency, hematuria and urgency.  Musculoskeletal: Negative for back pain, joint pain, myalgias and neck pain.  Skin: Negative for itching and rash.  Neurological: Negative for dizziness, tremors and  weakness.  Endo/Heme/Allergies: Does not bruise/bleed easily.  Psychiatric/Behavioral: Negative for depression. The patient is not nervous/anxious and does not have insomnia.     Objective: BP 120/80   Pulse 71   Ht  (1.651 m)   Wt 201 lb (91.2 kg)   BMI 33.45 kg/m  Physical Exam  Constitutional: She is oriented to person, place, and time. She appears well-developed and well-nourished. No distress.  Musculoskeletal: Normal range of motion.  Neurological: She is alert and oriented to person, place, and time.  Skin: Skin is warm and dry.  Psychiatric: She has a normal mood and affect.  Vitals reviewed. Back no CVAT, mild upper Right flank T  Results for orders placed or performed in visit on 12/21/16  POCT urinalysis dipstick  Result Value Ref Range   Color, UA yellow    Clarity, UA clear    Glucose, UA neg    Bilirubin, UA neg    Ketones, UA neg    Spec Grav, UA 1.020 1.010 - 1.025   Blood, UA positive    pH, UA 6.5 5.0 - 8.0   Protein, UA neg    Urobilinogen, UA 0.2 0.2 or 1.0 E.U./dL   Nitrite, UA neg    Leukocytes, UA Negative Negative   ASSESSMENT/PLAN:   Hematuria, likely right sided kidney stone (new unstable at this time)  Visit Diagnoses    Right-sided low back pain without sciatica, unspecified chronicity    -  Primary   Relevant Orders   POCT urinalysis dipstick (Completed)    Fluids and Advil, referral urology if persists/worsens No sign of  infection  Annamarie Major, MD, [facility], [facility] 12/21/2016  2:20 PM

## 2017-11-18 ENCOUNTER — Ambulatory Visit (INDEPENDENT_AMBULATORY_CARE_PROVIDER_SITE_OTHER): Payer: BLUE CROSS/BLUE SHIELD | Admitting: Family Medicine

## 2017-11-18 DIAGNOSIS — Z23 Encounter for immunization: Secondary | ICD-10-CM

## 2018-03-03 ENCOUNTER — Encounter: Payer: Self-pay | Admitting: Obstetrics & Gynecology

## 2018-03-03 ENCOUNTER — Ambulatory Visit (INDEPENDENT_AMBULATORY_CARE_PROVIDER_SITE_OTHER): Payer: BLUE CROSS/BLUE SHIELD | Admitting: Obstetrics & Gynecology

## 2018-03-03 VITALS — BP 120/80 | Ht 65.0 in | Wt 218.0 lb

## 2018-03-03 DIAGNOSIS — Z8041 Family history of malignant neoplasm of ovary: Secondary | ICD-10-CM

## 2018-03-03 DIAGNOSIS — Z01419 Encounter for gynecological examination (general) (routine) without abnormal findings: Secondary | ICD-10-CM

## 2018-03-03 DIAGNOSIS — N920 Excessive and frequent menstruation with regular cycle: Secondary | ICD-10-CM

## 2018-03-03 DIAGNOSIS — Z1239 Encounter for other screening for malignant neoplasm of breast: Secondary | ICD-10-CM

## 2018-03-03 NOTE — Patient Instructions (Addendum)
PAP every three years (due 2020) Mammogram every year (soon)    Call [phone] to schedule at [facility] (with PCP)  Endometrial Ablation Endometrial ablation is a procedure that destroys the thin inner layer of the lining of the uterus (endometrium). This procedure may be done:  To stop heavy periods.  To stop bleeding that is causing anemia.  To control irregular bleeding.  To treat bleeding caused by small tumors (fibroids) in the endometrium. This procedure is often an alternative to major surgery, such as removal of the uterus and cervix (hysterectomy). As a result of this procedure:  You may not be able to have children. However, if you are premenopausal (you have not gone through menopause): ? You may still have a small chance of getting pregnant. ? You will need to use a reliable method of birth control after the procedure to prevent pregnancy.  You may stop having a menstrual period, or you may have only a small amount of bleeding during your period. Menstruation may return several years after the procedure. Tell a health care provider about:  Any allergies you have.  All medicines you are taking, including vitamins, herbs, eye drops, creams, and over-the-counter medicines.  Any problems you or family members have had with the use of anesthetic medicines.  Any blood disorders you have.  Any surgeries you have had.  Any medical conditions you have. What are the risks? Generally, this is a safe procedure. However, problems may occur, including:  A hole (perforation) in the uterus or bowel.  Infection of the uterus, bladder, or vagina.  Bleeding.  Damage to other structures or organs.  An air bubble in the lung (air embolus).  Problems with pregnancy after the procedure.  Failure of the procedure.  Decreased ability to diagnose cancer in the endometrium. What happens before the procedure?  You will have tests of your endometrium to make sure  there are no pre-cancerous cells or cancer cells present.  You may have an ultrasound of the uterus.  You may be given medicines to thin the endometrium.  Ask your health care provider about: ? Changing or stopping your regular medicines. This is especially important if you take diabetes medicines or blood thinners. ? Taking medicines such as aspirin and ibuprofen. These medicines can thin your blood. Do not take these medicines before your procedure if your doctor tells you not to.  Plan to have someone take you home from the hospital or clinic. What happens during the procedure?   You will lie on an exam table with your feet and legs supported as in a pelvic exam.  To lower your risk of infection: ? Your health care team will wash or sanitize their hands and put on germ-free (sterile) gloves. ? Your genital area will be washed with soap.  An IV tube will be inserted into one of your veins.  You will be given a medicine to help you relax (sedative).  A surgical instrument with a light and camera (resectoscope) will be inserted into your vagina and moved into your uterus. This allows your surgeon to see inside your uterus.  Endometrial tissue will be removed using one of the following methods: ? Radiofrequency. This method uses a radiofrequency-alternating electric current to remove the endometrium. ? Cryotherapy. This method uses extreme cold to freeze the endometrium. ? Heated-free liquid. This method uses a heated saltwater (saline) solution to remove the endometrium. ? Microwave. This method uses high-energy microwaves to heat up the endometrium and  remove it. ? Thermal balloon. This method involves inserting a catheter with a balloon tip into the uterus. The balloon tip is filled with heated fluid to remove the endometrium. The procedure may vary among health care providers and hospitals. What happens after the procedure?  Your blood pressure, heart rate, breathing rate, and  blood oxygen level will be monitored until the medicines you were given have worn off.  As tissue healing occurs, you may notice vaginal bleeding for 4-6 weeks after the procedure. You may also experience: ? Cramps. ? Thin, watery vaginal discharge that is light pink or brown in color. ? A need to urinate more frequently than usual. ? Nausea.  Do not drive for 24 hours if you were given a sedative.  Do not have sex or insert anything into your vagina until your health care provider approves. Summary  Endometrial ablation is done to treat the many causes of heavy menstrual bleeding.  The procedure may be done only after medications have been tried to control the bleeding.  Plan to have someone take you home from the hospital or clinic. This information is not intended to replace advice given to you by your health care provider. Make sure you discuss any questions you have with your health care provider. Document Released: 01/03/2004 Document Revised: 08/10/2017 Document Reviewed: 03/12/2016 Elsevier Interactive Patient Education  2019 [employer]

## 2018-03-03 NOTE — Progress Notes (Signed)
HPI:      [name] is a 42 y.o. [id number] who LMP was Patient's last menstrual period was 02/20/2018., she presents today for her annual examination. The patient has no complaints today. The patient is sexually active. Her last pap: approximate date 2017 and was normal and last mammogram: patient has never had a mammogram. The patient does perform self breast exams.  There is no notable family history of breast or ovarian cancer in her family.  The patient has regular exercise: yes.  The patient denies current symptoms of depression.  PERIODS HEAVY and PAINFUL for a few days per month.  Prior SE to hormones, Nexplanon.  GYN History: Contraception: tubal ligation  PMHx: Past Medical History:  Diagnosis Date  . Complication of anesthesia    severe itching after anesthesia with 2 c-sections.  . Gestational diabetes    Past Surgical History:  Procedure Laterality Date  . CESAREAN SECTION  2007 & 2010   x 2  . CESAREAN SECTION WITH BILATERAL TUBAL LIGATION N/A 09/03/2016   Procedure: CESAREAN SECTION WITH BILATERAL TUBAL LIGATION;  Surgeon: Emily MustardHarris, Siedah Sedor P, MD;  Location: [facility] ORS;  Service: Obstetrics;  Laterality: N/A;  . WISDOM TOOTH EXTRACTION  1997   x 4   Family History  Problem Relation Age of Onset  . Urolithiasis Brother   . Cancer Mother        bile duct  . Diabetes Mother   . Depression Mother   . Urolithiasis Mother   . Diabetes Maternal Grandfather   . Uterine cancer Maternal Aunt   . Ovarian cancer Paternal Aunt   . Brain cancer Paternal Uncle    Social History   Tobacco Use  . Smoking status: Former Smoker    Last attempt to quit: 09/03/2003    Years since quitting: 14.5  . Smokeless tobacco: Never Used  Substance Use Topics  . Alcohol use: No    Alcohol/week: 0.0 standard drinks  . Drug use: No    Current Outpatient Medications:  .  flintstones complete (FLINTSTONES) 60 MG chewable tablet, Chew 2 tablets by mouth daily., Disp: , Rfl:  .   ibuprofen (ADVIL,MOTRIN) 600 MG tablet, Take 1 tablet (600 mg total) by mouth every 6 (six) hours as needed for fever or headache. (Patient not taking: Reported on 03/03/2018), Disp: 30 tablet, Rfl: 0 .  oxyCODONE-acetaminophen (PERCOCET/ROXICET) 5-325 MG tablet, Take 2 tablets by mouth every 6 (six) hours as needed (breakthrough pain). (Patient not taking: Reported on 12/21/2016), Disp: 30 tablet, Rfl: 0 Allergies: Patient has no known allergies.  Review of Systems  Constitutional: Negative for chills, fever and malaise/fatigue.  HENT: Negative for congestion, sinus pain and sore throat.   Eyes: Negative for blurred vision and pain.  Respiratory: Negative for cough and wheezing.   Cardiovascular: Negative for chest pain and leg swelling.  Gastrointestinal: Negative for abdominal pain, constipation, diarrhea, heartburn, nausea and vomiting.  Genitourinary: Negative for dysuria, frequency, hematuria and urgency.  Musculoskeletal: Negative for back pain, joint pain, myalgias and neck pain.  Skin: Negative for itching and rash.  Neurological: Negative for dizziness, tremors and weakness.  Endo/Heme/Allergies: Does not bruise/bleed easily.  Psychiatric/Behavioral: Negative for depression. The patient is not nervous/anxious and does not have insomnia.     Objective: BP 120/80   Ht 5\' 5"  (1.651 m)   Wt 218 lb (98.9 kg)   LMP 02/20/2018   BMI 36.28 kg/m   Filed Weights   03/03/18 1508  Weight: 218  lb (98.9 kg)   Body mass index is 36.28 kg/m. Physical Exam Constitutional:      General: She is not in acute distress.    Appearance: She is well-developed.  Genitourinary:     Pelvic exam was performed with patient supine.     Vagina, uterus and rectum normal.     No lesions in the vagina.     No vaginal bleeding.     No cervical motion tenderness, friability, lesion or polyp.     Uterus is mobile.     Uterus is not enlarged.     No uterine mass detected.    Uterus is midaxial.      No right or left adnexal mass present.     Right adnexa not tender.     Left adnexa not tender.  HENT:     Head: Normocephalic and atraumatic. No laceration.     Right Ear: Hearing normal.     Left Ear: Hearing normal.     Mouth/Throat:     Pharynx: Uvula midline.  Eyes:     Pupils: Pupils are equal, round, and reactive to light.  Neck:     Musculoskeletal: Normal range of motion and neck supple.     Thyroid: No thyromegaly.  Cardiovascular:     Rate and Rhythm: Normal rate and regular rhythm.     Heart sounds: No murmur. No friction rub. No gallop.   Pulmonary:     Effort: Pulmonary effort is normal. No respiratory distress.     Breath sounds: Normal breath sounds. No wheezing.  Chest:     Breasts:        Right: No mass, skin change or tenderness.        Left: No mass, skin change or tenderness.  Abdominal:     General: Bowel sounds are normal. There is no distension.     Palpations: Abdomen is soft.     Tenderness: There is no abdominal tenderness. There is no rebound.  Musculoskeletal: Normal range of motion.  Neurological:     Mental Status: She is alert and oriented to person, place, and time.     Cranial Nerves: No cranial nerve deficit.  Skin:    General: Skin is warm and dry.  Psychiatric:        Judgment: Judgment normal.  Vitals signs reviewed.   Assessment:  ANNUAL EXAM 1. Women's annual routine gynecological examination   2. Family history of ovarian cancer   3. Screening for breast cancer   4. Menorrhagia with regular cycle    Screening Plan:            1.  Cervical Screening-  Pap smear schedule reviewed with patient  2. Breast screening- Exam annually and mammogram>40 planned   3. Colonoscopy every 10 years, Hemoccult testing - after age 40  4. Labs managed by PCP  5. Counseling for contraception: bilateral tubal ligation   6. Menorrhagia w cramps.  Ablation discussed as option. Also hormones (prior SE), hysterectomy.  Info provided     F/U  Return in about 1 year (around 03/04/2019) for Annual.  Annamarie MajorPaul Algie Westry, MD, Merlinda FrederickFACOG [facility], [facility] 03/03/2018  3:37 PM

## 2018-03-04 ENCOUNTER — Ambulatory Visit
Admission: RE | Admit: 2018-03-04 | Discharge: 2018-03-04 | Disposition: A | Discharge status: Home / Self Care | Payer: BLUE CROSS/BLUE SHIELD | Source: Ambulatory Visit | Attending: Obstetrics & Gynecology | Admitting: Obstetrics & Gynecology

## 2018-03-04 DIAGNOSIS — Z1239 Encounter for other screening for malignant neoplasm of breast: Secondary | ICD-10-CM

## 2018-03-08 ENCOUNTER — Telehealth: Payer: Self-pay | Admitting: Obstetrics & Gynecology

## 2018-03-08 NOTE — Telephone Encounter (Signed)
Patient is aware of H&P for in-office procedure on 03/29/18 @ 8:20am, and IN OFFICE ABLATION, HYSTEROSCOPY on 04/05/18 @ 8:50am. Patient is aware we [facility], and out-of-network benefits and procedure costs were discussed.

## 2018-03-08 NOTE — Telephone Encounter (Signed)
-----   Message from Nadara Mustardobert P Harris, MD sent at 03/03/2018  3:35 PM EST ----- Regarding: Ablation Prior Auth for ablation w new insurance starting 2020 [facility](BCBS marketplace one)  Surgery Booking Request Patient Full Name:  Emily Warren  MRN: [id number]  DOB: 09/10/1975  Surgeon: Letitia Libraobert Paul Harris, MD  Requested Surgery Date and Time: Jan 22,27, or 28 Primary Diagnosis AND Code: Menorrhagia Secondary Diagnosis and Code:  Surgical Procedure: IN OFFICE ABLATION, HYSTEROSCOPY L&D Notification: No Admission Status: IN OFFICE Length of Surgery: 20 min Special Case Needs: Minerva H&P: yes (date)

## 2018-03-10 NOTE — Telephone Encounter (Signed)
HOLD for Crystal to confirm equipment. Crystal to let me know if the patient needs to be rescheduled.

## 2018-03-15 NOTE — Telephone Encounter (Signed)
No prior auth required. Ref# [id number].

## 2018-03-18 ENCOUNTER — Telehealth: Payer: Self-pay | Admitting: Obstetrics & Gynecology

## 2018-03-18 NOTE — Telephone Encounter (Signed)
Patient is calling wanting to cancel her schedule appointments with Dr. Tiburcio Pea. Please call patient

## 2018-03-18 NOTE — Telephone Encounter (Signed)
Patient cancelled the in-office ablation due to costs. She hopes to enroll in a new insurance plan next year that will be in-network.

## 2018-03-22 ENCOUNTER — Encounter: Payer: Self-pay | Admitting: Obstetrics and Gynecology

## 2018-03-23 ENCOUNTER — Telehealth: Payer: Self-pay | Admitting: Obstetrics & Gynecology

## 2018-03-23 NOTE — Telephone Encounter (Signed)
Per [place], no prior Berkley Harvey is required.

## 2018-03-23 NOTE — Telephone Encounter (Signed)
Patient is aware of H&P for in-office procedure on 04/05/18 @ 8:40am w/ Dr Tiburcio Pea, and IN-OFFICE ABLATION, HYSTEROSCOPY on 04/08/18 @ 9:50am.

## 2018-03-23 NOTE — Telephone Encounter (Signed)
Patient is aware [facility] plans are now in-network. Patient will call back to schedule.

## 2018-03-23 NOTE — Telephone Encounter (Signed)
Rec'd [place] email that [facility] plans are now in-network for our practice. Lmtrc.

## 2018-03-29 ENCOUNTER — Encounter: Payer: BLUE CROSS/BLUE SHIELD | Admitting: Obstetrics & Gynecology

## 2018-04-05 ENCOUNTER — Ambulatory Visit (INDEPENDENT_AMBULATORY_CARE_PROVIDER_SITE_OTHER): Payer: BLUE CROSS/BLUE SHIELD | Admitting: Obstetrics & Gynecology

## 2018-04-05 ENCOUNTER — Ambulatory Visit: Payer: BLUE CROSS/BLUE SHIELD | Admitting: Obstetrics & Gynecology

## 2018-04-05 ENCOUNTER — Encounter: Payer: Self-pay | Admitting: Obstetrics & Gynecology

## 2018-04-05 VITALS — BP 120/80 | Ht 65.0 in | Wt 221.0 lb

## 2018-04-05 DIAGNOSIS — N920 Excessive and frequent menstruation with regular cycle: Secondary | ICD-10-CM

## 2018-04-05 MED ORDER — PROMETHAZINE HCL 25 MG PO TABS: 25.0000 mg | ORAL_TABLET | Freq: Once | ORAL | 0 refills | Status: DC

## 2018-04-05 MED ORDER — DIAZEPAM 5 MG PO TABS: 5.0000 mg | ORAL_TABLET | Freq: Once | ORAL | 0 refills | Status: AC

## 2018-04-05 MED ORDER — MISOPROSTOL 200 MCG PO TABS: 200.0000 ug | ORAL_TABLET | Freq: Once | ORAL | 0 refills | Status: DC

## 2018-04-05 MED ORDER — HYDROCODONE-ACETAMINOPHEN 5-325 MG PO TABS: 1.0000 | ORAL_TABLET | Freq: Four times a day (QID) | ORAL | 0 refills | Status: DC | PRN

## 2018-04-05 MED ORDER — IBUPROFEN 600 MG PO TABS: 600.0000 mg | ORAL_TABLET | Freq: Four times a day (QID) | ORAL | 3 refills | Status: DC | PRN

## 2018-04-05 NOTE — Patient Instructions (Signed)
  Endometrial Ablation Pre-Procedural Instructions for Patient   You may have a light meal prior to coming to the office if desired.    You can plan on your appointment taking about 1 hour.  The actual procedure lasts for 1/2 hour but you will need to remain in the office for a short period after the procedure.    You may feel drowsy from the medication administered prior to and during the procedure. You should arrange for transportation to and from the office.    The following medications have been prescribed to you.  Please follow the doctor's instructions in taking these medications:  Day before Procedure    Cytotec 200 mg vaginally at bedtime    Ibuprofen 800 mg one by mouth three times a day for 2 days before procedure    Day of Procedure  Phenergan 25 mg  by mouth 1 hour before procedure Norco 2 by mouth 1 hour before procedure Valium 5mg  1 by mouth 1 hour before procedure Norco 5/325 mg 1 to 2 by mouth every 4-5 hours as needed for pain Ibuprofen 800 mg 1 by mouth every 8 hours as needed for pain      

## 2018-04-05 NOTE — Progress Notes (Signed)
PRE-OPERATIVE HISTORY AND PHYSICAL EXAM  HPI:  Emily Warren is a 43 y.o. [id number] Patient's last menstrual period was 03/19/2018.; she is being admitted for surgery related to abnormal uterine bleeding.  Plans ablation.  No desire for futre pregnancy. BTL as BC.  PMHx: Past Medical History:  Diagnosis Date  . Complication of anesthesia    severe itching after anesthesia with 2 c-sections.  . Family history of cancer of extrahepatic bile ducts   . Family history of ovarian cancer    1/20 cancer genetic testing letter sent  . Gestational diabetes    Past Surgical History:  Procedure Laterality Date  . CESAREAN SECTION  2007 & 2010   x 2  . CESAREAN SECTION WITH BILATERAL TUBAL LIGATION N/A 09/03/2016   Procedure: CESAREAN SECTION WITH BILATERAL TUBAL LIGATION;  Surgeon: Nadara Mustard, MD;  Location: [facility] ORS;  Service: Obstetrics;  Laterality: N/A;  . WISDOM TOOTH EXTRACTION  1997   x 4   Family History  Problem Relation Age of Onset  . Urolithiasis Brother   . Cancer Mother 76       bile duct  . Diabetes Mother   . Depression Mother   . Urolithiasis Mother   . Diabetes Maternal Grandfather   . Uterine cancer Maternal Aunt 70  . Ovarian cancer Paternal Aunt 75  . Brain cancer Paternal Uncle    Social History   Tobacco Use  . Smoking status: Former Smoker    Last attempt to quit: 09/03/2003    Years since quitting: 14.5  . Smokeless tobacco: Never Used  Substance Use Topics  . Alcohol use: No    Alcohol/week: 0.0 standard drinks  . Drug use: No    Current Outpatient Medications:  .  flintstones complete (FLINTSTONES) 60 MG chewable tablet, Chew 2 tablets by mouth daily., Disp: , Rfl:  .  diazepam (VALIUM) 5 MG tablet, Take 1 tablet (5 mg total) by mouth once for 1 dose. One hour prior to procedure, Disp: 2 tablet, Rfl: 0 .  HYDROcodone-acetaminophen (NORCO) 5-325 MG tablet, Take 1 tablet by mouth every 6 (six) hours as needed for moderate pain. Take 2 pills  One Hour Prior to Procedure, Disp: 10 tablet, Rfl: 0 .  ibuprofen (ADVIL,MOTRIN) 600 MG tablet, Take 1 tablet (600 mg total) by mouth every 6 (six) hours as needed for fever or headache. (Patient not taking: Reported on 03/03/2018), Disp: 30 tablet, Rfl: 0 .  ibuprofen (ADVIL,MOTRIN) 600 MG tablet, Take 1 tablet (600 mg total) by mouth every 6 (six) hours as needed for moderate pain., Disp: 60 tablet, Rfl: 3 .  misoprostol (CYTOTEC) 200 MCG tablet, Take 1 tablet (200 mcg total) by mouth once for 1 dose. Night before procedure, Disp: 1 tablet, Rfl: 0 .  oxyCODONE-acetaminophen (PERCOCET/ROXICET) 5-325 MG tablet, Take 2 tablets by mouth every 6 (six) hours as needed (breakthrough pain). (Patient not taking: Reported on 12/21/2016), Disp: 30 tablet, Rfl: 0 .  promethazine (PHENERGAN) 25 MG tablet, Take 1 tablet (25 mg total) by mouth once for 1 dose. One hour before procedure, Disp: 5 tablet, Rfl: 0 Allergies: Patient has no known allergies.  Review of Systems  All other systems reviewed and are negative.   Objective: BP 120/80   Ht 5\' 5"  (1.651 m)   Wt 221 lb (100.2 kg)   LMP 03/19/2018   BMI 36.78 kg/m   Filed Weights   04/05/18 0846  Weight: 221 lb (100.2 kg)   Physical  Exam Constitutional:      General: She is not in acute distress.    Appearance: She is well-developed.  Musculoskeletal: Normal range of motion.  Neurological:     Mental Status: She is alert and oriented to person, place, and time.  Skin:    General: Skin is warm and dry.  Vitals signs reviewed.   Assessment: 1. Menorrhagia with regular cycle   Plan In Office Ablation, w hysteroscopy and EMB then  I have had a careful discussion with this patient about all the options available and the risk/benefits of each. I have fully informed this patient that surgery may subject her to a variety of discomforts and risks: She understands that most patients have surgery with little difficulty, but problems can happen ranging  from minor to fatal. These include nausea, vomiting, pain, bleeding, infection, poor healing, hernia, or formation of adhesions. Unexpected reactions may occur from any drug or anesthetic given. Unintended injury may occur to other pelvic or abdominal structures such as Fallopian tubes, ovaries, bladder, ureter (tube from kidney to bladder), or bowel. Nerves going from the pelvis to the legs may be injured. Any such injury may require immediate or later additional surgery to correct the problem. Excessive blood loss requiring transfusion is very unlikely but possible. Dangerous blood clots may form in the legs or lungs. Physical and sexual activity will be restricted in varying degrees for an indeterminate period of time but most often 2-6 weeks.  Finally, she understands that it is impossible to list every possible undesirable effect and that the condition for which surgery is done is not always cured or significantly improved, and in rare cases may be even worse.Ample time was given to answer all questions. A total of 15 minutes were spent face-to-face with the patient during this encounter and over half of that time dealt with counseling and coordination of care.   Annamarie MajorPaul Evonda Enge, MD, Merlinda FrederickFACOG [facility], [facility] 04/05/2018  9:01 AM

## 2018-04-08 ENCOUNTER — Ambulatory Visit (INDEPENDENT_AMBULATORY_CARE_PROVIDER_SITE_OTHER): Payer: BLUE CROSS/BLUE SHIELD | Admitting: Obstetrics & Gynecology

## 2018-04-08 ENCOUNTER — Encounter: Payer: Self-pay | Admitting: Obstetrics & Gynecology

## 2018-04-08 ENCOUNTER — Ambulatory Visit: Payer: BLUE CROSS/BLUE SHIELD | Admitting: Obstetrics & Gynecology

## 2018-04-08 VITALS — BP 120/80 | Ht 65.0 in | Wt 223.0 lb

## 2018-04-08 DIAGNOSIS — Z98891 History of uterine scar from previous surgery: Secondary | ICD-10-CM

## 2018-04-08 DIAGNOSIS — N920 Excessive and frequent menstruation with regular cycle: Secondary | ICD-10-CM | POA: Diagnosis not present

## 2018-04-08 MED ORDER — OXYCODONE-ACETAMINOPHEN 5-325 MG PO TABS: 2.0000 | ORAL_TABLET | Freq: Four times a day (QID) | ORAL | 0 refills | Status: DC | PRN

## 2018-04-08 NOTE — Patient Instructions (Addendum)
    Endometrial Ablation Post-Procedural Instructions for Patient   You may experience mild to moderate cramping (like menstrual cramping) and pinkish watery discharge.  This may last approximately 2 to 3 weeks. Use pads, not tampons during this time.  No sexual activity for 3 weeks post procedure.  Follow up medications and directions for taking the medications are    Percocet 1 or 2 by mouth every 4 to 6 hours as needed IBUPROFEN 800 mg by mouth three times a day for the next two days PHENERGAN 25 mg by mouth q 6 hours for nausea       Call our office if you develop any of the following: ? Fever of 100.4 or greater  ? Worsening pelvic pain  ? Nausea  ? Vomiting  ? Greenish vaginal discharge with odor   Office phone number: [phone]

## 2018-04-08 NOTE — Progress Notes (Signed)
Operative Note   04/08/2018  PRE-OP DIAGNOSIS: Menorrhagia   POST-OP DIAGNOSIS: same   SURGEON: Annamarie Major, MD, FACOG   PROCEDURE: Hysteroscopy with Endometrial Ablation  ANESTHESIA: Local, sedation  ESTIMATED BLOOD LOSS: min   SPECIMENS: none  FLUID DEFICIT: none   COMPLICATIONS: none   DISPOSITION and CONDITION: stable   FINDINGS: Exam under anesthesia revealed small, mobile small uterus with no masses and bilateral adnexa without masses or fullness. Hysteroscopy revealed no polyps, otherwise grossly normal appearing uterine cavity with bilateral tubal ostia and normal appearing endocervical canal.   PROCEDURE IN DETAIL: After informed consent was obtained, the patient was taken to the operating room where anesthesia was obtained without difficulty. The patient was positioned in the dorsal lithotomy position in Carson stirrups. The patient's bladder was catheterized with an in and out foley catheter. The patient was examined under anesthesia, with the above noted findings. The weightedspeculum was placed inside the patient's vagina, and the the anterior lip of the cervix was seen and grasped with the tenaculum. An Endocervical specimen was obtained with a kevorkian curette. The uterine cavity was sounded to 8cm, and then the cervix was progressively dilated to a 18 French-Pratt dilator. The Endosee hysteroscope was introduced, with saline fluid used to distend the intrauterine cavity, with the above noted findings.    The Minerva endometrial ablation device was then placed without difficulty. Measurements were obtained. Patient was noted to have a uterine length of 8 cm, a cervical length of 3 cm. The device is first tested and after confirmation the procedures performed. Length of procedure was 120 seconds. The ablation device is then removed.  Tenaculum was removed with excellent hemostasis noted. She was then taken out of dorsal lithotomy. Minimal discrepancy in fluid was noted. No  bleeding.   The patient tolerated the procedure well. Sponge, lap and needle counts were correct x2. The patient was taken to recovery room in excellent condition.  Annamarie Major, MD, Merl[facility] Frederick Ob/G[facility], [facility] 04/08/2018  2:10 PM

## 2018-04-22 ENCOUNTER — Ambulatory Visit (INDEPENDENT_AMBULATORY_CARE_PROVIDER_SITE_OTHER): Payer: BLUE CROSS/BLUE SHIELD | Admitting: Obstetrics & Gynecology

## 2018-04-22 ENCOUNTER — Encounter: Payer: Self-pay | Admitting: Obstetrics & Gynecology

## 2018-04-22 VITALS — BP 120/80 | Ht 65.0 in | Wt 222.0 lb

## 2018-04-22 DIAGNOSIS — N920 Excessive and frequent menstruation with regular cycle: Secondary | ICD-10-CM | POA: Diagnosis not present

## 2018-04-22 NOTE — Progress Notes (Signed)
  Follow-up Patient presents in follow up for her menorrhagia and dysmenorrhea, with recent treatment by way of  [facility] Endometrial Ablation for abnormal uterine bleeding, 2 weeks ago.  Subjective: Patient reports some discharge since the procedure; she is due for period but has not yet bled; otherwise from her nausea and pain rigth after the procedure she has noted improvement in her preop symptoms. Eating a regular diet without difficulty. The patient is not having any pain.  Activity: normal activities of daily living. Patient reports additional symptom's since surgery of No bleeding.  Objective: BP 120/80   Ht 5\' 5"  (1.651 m)   Wt 222 lb (100.7 kg)   BMI 36.94 kg/m  Physical Exam Constitutional:      General: She is not in acute distress.    Appearance: She is well-developed.  Musculoskeletal: Normal range of motion.  Neurological:     Mental Status: She is alert and oriented to person, place, and time.  Skin:    General: Skin is warm and dry.  Vitals signs reviewed.   Assessment: Menorrhagia with regular cycle 1. Menorrhagia with regular cycle She is now s/p  endometrial ablation progressing well  Plan: Patient has done well after procedure with no apparent complications.  I have discussed the post-operative course to date, and the expected progress moving forward.  The patient understands what complications to be concerned about.  I will see the patient in routine follow up, or sooner if needed.   Monitor cycles (if any) moving forward Activity plan:  No restriction. F/u annual (Dec)  Emily Warren 04/22/2018, 10:01 AM

## 2018-08-23 ENCOUNTER — Telehealth: Payer: Self-pay | Admitting: Obstetrics & Gynecology

## 2018-08-23 NOTE — Telephone Encounter (Signed)
Rec'd v/m message to return patient's call. Lmtrc.

## 2020-02-17 ENCOUNTER — Emergency Department: Payer: Medicaid Other

## 2020-02-17 ENCOUNTER — Observation Stay: Payer: Medicaid Other | Admitting: Anesthesiology

## 2020-02-17 ENCOUNTER — Observation Stay
Admission: EM | Admit: 2020-02-17 | Discharge: 2020-02-18 | Disposition: A | Discharge status: Home / Self Care | Payer: Medicaid Other | Attending: Emergency Medicine | Admitting: Emergency Medicine

## 2020-02-17 ENCOUNTER — Other Ambulatory Visit: Payer: Self-pay

## 2020-02-17 ENCOUNTER — Encounter
Admission: EM | Disposition: A | Discharge status: Home / Self Care | Payer: Self-pay | Source: Home / Self Care | Attending: Emergency Medicine

## 2020-02-17 ENCOUNTER — Encounter: Payer: Self-pay | Admitting: Emergency Medicine

## 2020-02-17 DIAGNOSIS — K810 Acute cholecystitis: Secondary | ICD-10-CM | POA: Diagnosis present

## 2020-02-17 DIAGNOSIS — Z20822 Contact with and (suspected) exposure to covid-19: Secondary | ICD-10-CM | POA: Insufficient documentation

## 2020-02-17 DIAGNOSIS — Z87891 Personal history of nicotine dependence: Secondary | ICD-10-CM | POA: Diagnosis not present

## 2020-02-17 DIAGNOSIS — K8000 Calculus of gallbladder with acute cholecystitis without obstruction: Secondary | ICD-10-CM | POA: Diagnosis not present

## 2020-02-17 DIAGNOSIS — R1013 Epigastric pain: Secondary | ICD-10-CM

## 2020-02-17 DIAGNOSIS — R1011 Right upper quadrant pain: Secondary | ICD-10-CM | POA: Diagnosis present

## 2020-02-17 LAB — URINALYSIS, COMPLETE (UACMP) WITH MICROSCOPIC
Glucose, UA: 50 mg/dL — AB
Hgb urine dipstick: NEGATIVE
Ketones, ur: 5 mg/dL — AB
Nitrite: NEGATIVE
Protein, ur: NEGATIVE mg/dL
Specific Gravity, Urine: 1.02 (ref 1.005–1.030)
pH: 8 (ref 5.0–8.0)

## 2020-02-17 LAB — RESP PANEL BY RT-PCR (FLU A&B, COVID) ARPGX2
Influenza A by PCR: NEGATIVE
Influenza B by PCR: NEGATIVE
SARS Coronavirus 2 by RT PCR: NEGATIVE

## 2020-02-17 LAB — COMPREHENSIVE METABOLIC PANEL
ALT: 28 U/L (ref 0–44)
AST: 22 U/L (ref 15–41)
Albumin: 4.3 g/dL (ref 3.5–5.0)
Alkaline Phosphatase: 93 U/L (ref 38–126)
Anion gap: 9 (ref 5–15)
BUN: 11 mg/dL (ref 6–20)
CO2: 25 mmol/L (ref 22–32)
Calcium: 9.2 mg/dL (ref 8.9–10.3)
Chloride: 102 mmol/L (ref 98–111)
Creatinine, Ser: 0.58 mg/dL (ref 0.44–1.00)
GFR, Estimated: >60 mL/min (ref >60)
Glucose, Bld: 142 mg/dL — ABNORMAL HIGH (ref 70–99)
Potassium: 3.8 mmol/L (ref 3.5–5.1)
Sodium: 136 mmol/L (ref 135–145)
Total Bilirubin: 1.3 mg/dL — ABNORMAL HIGH (ref 0.3–1.2)
Total Protein: 7.7 g/dL (ref 6.5–8.1)

## 2020-02-17 LAB — CBC
HCT: 40.2 % (ref 36.0–46.0)
Hemoglobin: 14 g/dL (ref 12.0–15.0)
MCH: 30.9 pg (ref 26.0–34.0)
MCHC: 34.8 g/dL (ref 30.0–36.0)
MCV: 88.7 fL (ref 80.0–100.0)
Platelets: 296 10*3/uL (ref 150–400)
RBC: 4.53 MIL/uL (ref 3.87–5.11)
RDW: 12.4 % (ref 11.5–15.5)
WBC: 15.2 10*3/uL — ABNORMAL HIGH (ref 4.0–10.5)
nRBC: 0 % (ref 0.0–0.2)

## 2020-02-17 LAB — TROPONIN I (HIGH SENSITIVITY): Troponin I (High Sensitivity): 3 ng/L (ref <18)

## 2020-02-17 LAB — LIPASE, BLOOD: Lipase: 21 U/L (ref 11–51)

## 2020-02-17 SURGERY — CHOLECYSTECTOMY, ROBOT-ASSISTED, LAPAROSCOPIC
Anesthesia: General | Site: Abdomen

## 2020-02-17 MED ORDER — KETOROLAC TROMETHAMINE 30 MG/ML IJ SOLN
INTRAMUSCULAR | Status: DC | PRN
  Administered 2020-02-17: 30 mg via INTRAVENOUS

## 2020-02-17 MED ORDER — ONDANSETRON HCL 4 MG/2ML IJ SOLN
INTRAMUSCULAR | Status: AC
  Filled 2020-02-17: qty 2

## 2020-02-17 MED ORDER — ENOXAPARIN SODIUM 60 MG/0.6ML ~~LOC~~ SOLN
0.5000 mg/kg | SUBCUTANEOUS | Status: DC
  Administered 2020-02-17: 23:00:00 50 mg via SUBCUTANEOUS
  Filled 2020-02-17 (×3): qty 0.6

## 2020-02-17 MED ORDER — ROCURONIUM BROMIDE 100 MG/10ML IV SOLN
INTRAVENOUS | Status: DC | PRN
  Administered 2020-02-17: 45 mg via INTRAVENOUS
  Administered 2020-02-17: 5 mg via INTRAVENOUS

## 2020-02-17 MED ORDER — DEXMEDETOMIDINE (PRECEDEX) IN NS 20 MCG/5ML (4 MCG/ML) IV SYRINGE
PREFILLED_SYRINGE | INTRAVENOUS | Status: DC | PRN
  Administered 2020-02-17 (×2): 8 ug via INTRAVENOUS

## 2020-02-17 MED ORDER — SODIUM CHLORIDE FLUSH 0.9 % IV SOLN
INTRAVENOUS | Status: AC
  Filled 2020-02-17: qty 3

## 2020-02-17 MED ORDER — PROPOFOL 10 MG/ML IV BOLUS
INTRAVENOUS | Status: AC
  Filled 2020-02-17: qty 20

## 2020-02-17 MED ORDER — FENTANYL CITRATE (PF) 100 MCG/2ML IJ SOLN
INTRAMUSCULAR | Status: DC | PRN
  Administered 2020-02-17: 100 ug via INTRAVENOUS
  Administered 2020-02-17 (×2): 25 ug via INTRAVENOUS

## 2020-02-17 MED ORDER — PANTOPRAZOLE SODIUM 40 MG IV SOLR
40.0000 mg | Freq: Every day | INTRAVENOUS | Status: DC
  Administered 2020-02-17: 23:00:00 40 mg via INTRAVENOUS
  Filled 2020-02-17 (×4): qty 40

## 2020-02-17 MED ORDER — ACETAMINOPHEN 650 MG RE SUPP
650.0000 mg | Freq: Four times a day (QID) | RECTAL | Status: DC | PRN
  Filled 2020-02-17: qty 1

## 2020-02-17 MED ORDER — FENTANYL CITRATE (PF) 100 MCG/2ML IJ SOLN
INTRAMUSCULAR | Status: AC
  Filled 2020-02-17: qty 2

## 2020-02-17 MED ORDER — CHLORHEXIDINE GLUCONATE 0.12 % MT SOLN: 15.0000 mL | Freq: Once | OROMUCOSAL | Status: AC

## 2020-02-17 MED ORDER — ACETAMINOPHEN 325 MG PO TABS: 650.0000 mg | ORAL_TABLET | Freq: Four times a day (QID) | ORAL | Status: DC | PRN

## 2020-02-17 MED ORDER — DEXAMETHASONE SODIUM PHOSPHATE 10 MG/ML IJ SOLN
INTRAMUSCULAR | Status: AC
  Filled 2020-02-17: qty 1

## 2020-02-17 MED ORDER — SUCCINYLCHOLINE CHLORIDE 200 MG/10ML IV SOSY
PREFILLED_SYRINGE | INTRAVENOUS | Status: AC
  Filled 2020-02-17: qty 10

## 2020-02-17 MED ORDER — DEXMEDETOMIDINE (PRECEDEX) IN NS 20 MCG/5ML (4 MCG/ML) IV SYRINGE
PREFILLED_SYRINGE | INTRAVENOUS | Status: AC
  Filled 2020-02-17: qty 5

## 2020-02-17 MED ORDER — PIPERACILLIN-TAZOBACTAM 3.375 G IVPB
3.3750 g | Freq: Three times a day (TID) | INTRAVENOUS | Status: DC
  Administered 2020-02-17 (×2): 3.375 g via INTRAVENOUS
  Filled 2020-02-17 (×5): qty 50

## 2020-02-17 MED ORDER — ONDANSETRON HCL 4 MG/2ML IJ SOLN: 4.0000 mg | Freq: Four times a day (QID) | INTRAMUSCULAR | Status: DC | PRN

## 2020-02-17 MED ORDER — MIDAZOLAM HCL 2 MG/2ML IJ SOLN
INTRAMUSCULAR | Status: DC | PRN
  Administered 2020-02-17: 2 mg via INTRAVENOUS

## 2020-02-17 MED ORDER — PROPOFOL 10 MG/ML IV BOLUS
INTRAVENOUS | Status: DC | PRN
Start: 2020-02-17 — End: 2020-02-17
  Administered 2020-02-17: 180 mg via INTRAVENOUS
  Administered 2020-02-17: 20 mg via INTRAVENOUS

## 2020-02-17 MED ORDER — ORAL CARE MOUTH RINSE
15.0000 mL | Freq: Once | OROMUCOSAL | Status: AC
  Filled 2020-02-17: qty 15

## 2020-02-17 MED ORDER — ONDANSETRON HCL 4 MG/2ML IJ SOLN
INTRAMUSCULAR | Status: DC | PRN
  Administered 2020-02-17: 4 mg via INTRAVENOUS

## 2020-02-17 MED ORDER — BUPIVACAINE-EPINEPHRINE 0.25% -1:200000 IJ SOLN
INTRAMUSCULAR | Status: DC | PRN
  Administered 2020-02-17: 10 mL
  Administered 2020-02-17: 20 mL

## 2020-02-17 MED ORDER — MORPHINE SULFATE (PF) 4 MG/ML IV SOLN
6.0000 mg | Freq: Once | INTRAVENOUS | Status: DC
  Filled 2020-02-17: qty 2

## 2020-02-17 MED ORDER — LACTATED RINGERS IV SOLN
INTRAVENOUS | Status: DC
  Administered 2020-02-17: 13:00:00 100 mL/h via INTRAVENOUS

## 2020-02-17 MED ORDER — ACETAMINOPHEN 10 MG/ML IV SOLN
INTRAVENOUS | Status: DC | PRN
  Administered 2020-02-17: 1000 mg via INTRAVENOUS

## 2020-02-17 MED ORDER — ONDANSETRON 4 MG PO TBDP: 4.0000 mg | ORAL_TABLET | Freq: Four times a day (QID) | ORAL | Status: DC | PRN

## 2020-02-17 MED ORDER — LIDOCAINE HCL (CARDIAC) PF 100 MG/5ML IV SOSY
PREFILLED_SYRINGE | INTRAVENOUS | Status: DC | PRN
  Administered 2020-02-17: 100 mg via INTRAVENOUS

## 2020-02-17 MED ORDER — ENOXAPARIN SODIUM 40 MG/0.4ML ~~LOC~~ SOLN: 40.0000 mg | SUBCUTANEOUS | Status: DC

## 2020-02-17 MED ORDER — SUGAMMADEX SODIUM 200 MG/2ML IV SOLN
INTRAVENOUS | Status: DC | PRN
  Administered 2020-02-17: 200 mg via INTRAVENOUS

## 2020-02-17 MED ORDER — MORPHINE SULFATE (PF) 4 MG/ML IV SOLN: 4.0000 mg | Freq: Once | INTRAVENOUS | Status: DC

## 2020-02-17 MED ORDER — KETOROLAC TROMETHAMINE 30 MG/ML IJ SOLN
30.0000 mg | Freq: Once | INTRAMUSCULAR | Status: AC
  Administered 2020-02-17: 08:00:00 30 mg via INTRAVENOUS
  Filled 2020-02-17: qty 1

## 2020-02-17 MED ORDER — ACETAMINOPHEN 10 MG/ML IV SOLN
INTRAVENOUS | Status: AC
  Filled 2020-02-17: qty 100

## 2020-02-17 MED ORDER — ONDANSETRON HCL 4 MG/2ML IJ SOLN: 4.0000 mg | Freq: Once | INTRAMUSCULAR | Status: DC | PRN

## 2020-02-17 MED ORDER — SUCCINYLCHOLINE CHLORIDE 20 MG/ML IJ SOLN
INTRAMUSCULAR | Status: DC | PRN
  Administered 2020-02-17: 100 mg via INTRAVENOUS

## 2020-02-17 MED ORDER — FENTANYL CITRATE (PF) 100 MCG/2ML IJ SOLN: 25.0000 ug | INTRAMUSCULAR | Status: DC | PRN

## 2020-02-17 MED ORDER — MORPHINE SULFATE (PF) 4 MG/ML IV SOLN
4.0000 mg | INTRAVENOUS | Status: DC | PRN
Start: 2020-02-17 — End: 2020-02-18

## 2020-02-17 MED ORDER — CHLORHEXIDINE GLUCONATE 0.12 % MT SOLN
OROMUCOSAL | Status: AC
  Administered 2020-02-17: 13:00:00 15 mL
  Filled 2020-02-17: qty 15

## 2020-02-17 MED ORDER — ONDANSETRON 4 MG PO TBDP
4.0000 mg | ORAL_TABLET | Freq: Once | ORAL | Status: AC
  Administered 2020-02-17: 06:00:00 4 mg via ORAL
  Filled 2020-02-17: qty 1

## 2020-02-17 MED ORDER — DEXAMETHASONE SODIUM PHOSPHATE 10 MG/ML IJ SOLN
INTRAMUSCULAR | Status: DC | PRN
  Administered 2020-02-17: 10 mg via INTRAVENOUS

## 2020-02-17 MED ORDER — ONDANSETRON HCL 4 MG/2ML IJ SOLN
4.0000 mg | Freq: Once | INTRAMUSCULAR | Status: DC
  Filled 2020-02-17: qty 2

## 2020-02-17 MED ORDER — INDOCYANINE GREEN 25 MG IV SOLR
1.2500 mg | Freq: Once | INTRAVENOUS | Status: AC
  Administered 2020-02-17: 13:00:00 1.25 mg via INTRAVENOUS
  Filled 2020-02-17: qty 0.5

## 2020-02-17 MED ORDER — MIDAZOLAM HCL 2 MG/2ML IJ SOLN
INTRAMUSCULAR | Status: AC
  Filled 2020-02-17: qty 2

## 2020-02-17 MED ORDER — HYDROCODONE-ACETAMINOPHEN 5-325 MG PO TABS
1.0000 | ORAL_TABLET | ORAL | Status: DC | PRN
  Administered 2020-02-17 – 2020-02-18 (×5): 2 via ORAL
  Filled 2020-02-17 (×5): qty 2

## 2020-02-17 SURGICAL SUPPLY — 53 items
BAG INFUSER PRESSURE 100CC (MISCELLANEOUS) ×3 IMPLANT
BLADE SURG SZ11 CARB STEEL (BLADE) ×3 IMPLANT
CANISTER SUCT 1200ML W/VALVE (MISCELLANEOUS) IMPLANT
CANNULA REDUC XI 12-8 STAPL (CANNULA) ×1
CANNULA REDUC XI 12-8MM STAPL (CANNULA) ×1
CANNULA REDUCER 12-8 DVNC XI (CANNULA) ×1 IMPLANT
CHLORAPREP W/TINT 26 (MISCELLANEOUS) ×3 IMPLANT
CLIP VESOLOCK MED LG 6/CT (CLIP) ×3 IMPLANT
COVER WAND RF STERILE (DRAPES) ×3 IMPLANT
DECANTER SPIKE VIAL GLASS SM (MISCELLANEOUS) IMPLANT
DEFOGGER SCOPE WARMER CLEARIFY (MISCELLANEOUS) ×3 IMPLANT
DERMABOND ADVANCED (GAUZE/BANDAGES/DRESSINGS) ×2
DERMABOND ADVANCED .7 DNX12 (GAUZE/BANDAGES/DRESSINGS) ×1 IMPLANT
DRAPE ARM DVNC X/XI (DISPOSABLE) ×4 IMPLANT
DRAPE COLUMN DVNC XI (DISPOSABLE) ×1 IMPLANT
DRAPE DA VINCI XI ARM (DISPOSABLE) ×8
DRAPE DA VINCI XI COLUMN (DISPOSABLE) ×2
ELECT REM PT RETURN 9FT ADLT (ELECTROSURGICAL) ×3
ELECTRODE REM PT RTRN 9FT ADLT (ELECTROSURGICAL) ×1 IMPLANT
GLOVE BIO SURGEON STRL SZ 6.5 (GLOVE) ×4 IMPLANT
GLOVE BIO SURGEONS STRL SZ 6.5 (GLOVE) ×2
GOWN STRL REUS W/ TWL LRG LVL3 (GOWN DISPOSABLE) ×3 IMPLANT
GOWN STRL REUS W/TWL LRG LVL3 (GOWN DISPOSABLE) ×6
GRASPER SUT TROCAR 14GX15 (MISCELLANEOUS) ×3 IMPLANT
IRRIGATOR SUCT 8 DISP DVNC XI (IRRIGATION / IRRIGATOR) ×1 IMPLANT
IRRIGATOR SUCTION 8MM XI DISP (IRRIGATION / IRRIGATOR) ×2
IV NS 1000ML (IV SOLUTION) ×2
IV NS 1000ML BAXH (IV SOLUTION) ×1 IMPLANT
KIT PINK PAD W/HEAD ARE REST (MISCELLANEOUS) ×3
KIT PINK PAD W/HEAD ARM REST (MISCELLANEOUS) ×1 IMPLANT
LABEL OR SOLS (LABEL) ×3 IMPLANT
MANIFOLD NEPTUNE II (INSTRUMENTS) ×3 IMPLANT
NEEDLE HYPO 22GX1.5 SAFETY (NEEDLE) ×3 IMPLANT
NEEDLE INSUFFLATION 14GA 120MM (NEEDLE) IMPLANT
NS IRRIG 1000ML POUR BTL (IV SOLUTION) ×3 IMPLANT
NS IRRIG 500ML POUR BTL (IV SOLUTION) IMPLANT
OBTURATOR OPTICAL STANDARD 8MM (TROCAR) ×2
OBTURATOR OPTICAL STND 8 DVNC (TROCAR) ×1
OBTURATOR OPTICALSTD 8 DVNC (TROCAR) ×1 IMPLANT
PACK LAP CHOLECYSTECTOMY (MISCELLANEOUS) ×3 IMPLANT
POUCH SPECIMEN RETRIEVAL 10MM (ENDOMECHANICALS) ×3 IMPLANT
SEAL CANN UNIV 5-8 DVNC XI (MISCELLANEOUS) ×3 IMPLANT
SEAL XI 5MM-8MM UNIVERSAL (MISCELLANEOUS) ×6
SET TUBE SMOKE EVAC HIGH FLOW (TUBING) ×3 IMPLANT
SOLUTION ELECTROLUBE (MISCELLANEOUS) ×3 IMPLANT
STAPLER CANNULA SEAL DVNC XI (STAPLE) ×1 IMPLANT
STAPLER CANNULA SEAL XI (STAPLE) ×2
SUT MNCRL 4-0 (SUTURE) ×2
SUT MNCRL 4-0 27XMFL (SUTURE) ×1
SUT VIC AB 3-0 SH 27 (SUTURE)
SUT VIC AB 3-0 SH 27X BRD (SUTURE) IMPLANT
SUT VICRYL 0 AB UR-6 (SUTURE) IMPLANT
SUTURE MNCRL 4-0 27XMF (SUTURE) ×1 IMPLANT

## 2020-02-17 NOTE — Op Note (Signed)
Preoperative diagnosis: Acute cholecystitis  Postoperative diagnosis: Acute cholecystitis  Procedure: Robotic Assisted Laparoscopic Cholecystectomy.   Anesthesia: GETA   Surgeon: Dr. Hazle Quant  Wound Classification: Clean Contaminated  Indications: Patient is a 44 y.o. female developed right upper quadrant pain, nausea, vomiting and leukocytosis and on workup was found to have cholelithiasis with a normal common duct with positive Murphys's sign. Robotic Assisted Laparoscopic cholecystectomy was elected.  Findings: Edematous/Distended Gallbladder:    No ICG green in gallbladder consistend with cholecystitis:    Critical view of safety achieved:   Cystic duct and artery identified, ligated and divided Adequate hemostasis  Description of procedure: The patient was placed on the operating table in the supine position. General anesthesia was induced. A time-out was completed verifying correct patient, procedure, site, positioning, and implant(s) and/or special equipment prior to beginning this procedure. An orogastric tube was placed. The abdomen was prepped and draped in the usual sterile fashion.  An incision was made in a natural skin line below the umbilicus.  The fascia was elevated and the Veress needle inserted. Proper position was confirmed by aspiration and saline meniscus test.  The abdomen was insufflated with carbon dioxide to a pressure of 15 mmHg. The patient tolerated insufflation well. A 8-mm trocar was then inserted in optiview fashion.  The laparoscope was inserted and the abdomen inspected. No injuries from initial trocar placement were noted. Additional trocars were then inserted in the following locations: an 8-mm trocar in the left lateral abdomen, and another two 8-mm trocars to the right side of the abdomen 5 cm appart. The umbilical trocar was changed to a 12 mm trocar all under direct visualization. The abdomen was inspected and no abnormalities were found. The  table was placed in the reverse Trendelenburg position with the right side up. The robotic arms were docked and target anatomy identified. Instrument inserted under direct visualization.  Filmy adhesions between the gallbladder and omentum, duodenum and transverse colon were lysed with electrocautery. The dome of the gallbladder was grasped with a prograsp and retracted over the dome of the liver. The infundibulum was also grasped with an atraumatic grasper and retracted toward the right lower quadrant. This maneuver exposed Calot's triangle. The peritoneum overlying the gallbladder infundibulum was then incised and the cystic duct and cystic artery identified and circumferentially dissected. Critical view of safety reviewed before ligating any structure. Firefly images taken to visualize biliary ducts. The cystic duct and cystic artery were then doubly clipped and divided close to the gallbladder.  The gallbladder was then dissected from its peritoneal attachments by electrocautery. Hemostasis was checked and the gallbladder and contained stones were removed using an endoscopic retrieval bag. The gallbladder was passed off the table as a specimen. The gallbladder fossa was copiously irrigated with saline and hemostasis was obtained. There was no evidence of bleeding from the gallbladder fossa or cystic artery or leakage of the bile from the cystic duct stump. Secondary trocars were removed under direct vision. No bleeding was noted. The robotic arms were undoked. The scope was withdrawn and the umbilical trocar removed. The abdomen was allowed to collapse. The fascia of the 59mm trocar sites was closed with figure-of-eight 0 vicryl sutures. The skin was closed with subcuticular sutures of 4-0 monocryl and topical skin adhesive. The orogastric tube was removed.  The patient tolerated the procedure well and was taken to the postanesthesia care unit in stable condition.   Specimen: Gallbladder  Complications:  None  EBL: 5 mL

## 2020-02-17 NOTE — Anesthesia Preprocedure Evaluation (Signed)
Anesthesia Evaluation  Patient identified by MRN, date of birth, ID band Patient awake    Reviewed: Allergy & Precautions, NPO status , Patient's Chart, lab work & pertinent test results  History of Anesthesia Complications (+) history of anesthetic complications (itching with C-sections)  Airway Mallampati: II       Dental   Pulmonary neg sleep apnea, neg COPD, Not current smoker, former smoker,           Cardiovascular (-) hypertension(-) Past MI and (-) CHF (-) dysrhythmias (-) Valvular Problems/Murmurs     Neuro/Psych neg Seizures    GI/Hepatic Neg liver ROS, GERD  ,  Endo/Other  diabetes, Gestational  Renal/GU negative Renal ROS     Musculoskeletal   Abdominal   Peds  Hematology   Anesthesia Other Findings   Reproductive/Obstetrics                             Anesthesia Physical Anesthesia Plan  ASA: II and emergent  Anesthesia Plan: General   Post-op Pain Management:    Induction: Intravenous and Rapid sequence  PONV Risk Score and Plan: 3 and Ondansetron and Dexamethasone  Airway Management Planned: Oral ETT  Additional Equipment:   Intra-op Plan:   Post-operative Plan:   Informed Consent: I have reviewed the patients History and Physical, chart, labs and discussed the procedure including the risks, benefits and alternatives for the proposed anesthesia with the patient or authorized representative who has indicated his/her understanding and acceptance.       Plan Discussed with:   Anesthesia Plan Comments:         Anesthesia Quick Evaluation

## 2020-02-17 NOTE — Progress Notes (Signed)
Patietn admitted to Room 347 from PACU. RN report completed. Pt comfortable, pain well controlled. Pt oriented to room, call bell, phone numbers, fall risk, etc. Will call for assistance to BR when ready. Family remains at bedside.

## 2020-02-17 NOTE — Progress Notes (Signed)
PHARMACIST - PHYSICIAN COMMUNICATION  CONCERNING:  Enoxaparin (Lovenox) for DVT Prophylaxis    RECOMMENDATION: Patient was prescribed enoxaprin 40mg  q24 hours for VTE prophylaxis.   Filed Weights   02/17/20 0427  Weight: 102.1 kg (225 lb)    Body mass index is 37.44 kg/m.  Estimated Creatinine Clearance: 108.5 mL/min (by C-G formula based on SCr of 0.58 mg/dL).   Based on [facility] policy patient is candidate for enoxaparin 0.5mg /kg TBW SQ every 24 hours based on BMI being >30.   DESCRIPTION: Pharmacy has adjusted enoxaparin dose per [facility] policy.  Patient is now receiving enoxaparin 50 mg every 24 hours    [facility], PharmD Clinical Pharmacist  02/17/2020 11:07 AM

## 2020-02-17 NOTE — Anesthesia Postprocedure Evaluation (Signed)
Anesthesia Post Note  Patient: Emily Warren  Procedure(s) Performed: XI ROBOTIC ASSISTED LAPAROSCOPIC CHOLECYSTECTOMY (N/A Abdomen)  Patient location during evaluation: PACU Anesthesia Type: General Level of consciousness: awake and alert Pain management: pain level controlled Vital Signs Assessment: post-procedure vital signs reviewed and stable Respiratory status: spontaneous breathing and respiratory function stable Cardiovascular status: stable Anesthetic complications: no   No complications documented.   Last Vitals:  Vitals:   02/17/20 1541 02/17/20 1546  BP: 124/72 (!) 110/97  Pulse: 87 88  Resp: 19 19  Temp: (!) 36 C   SpO2: 99% 98%    Last Pain:  Vitals:   02/17/20 1546  TempSrc:   PainSc: 0-No pain                 Jasmaine Rochel K

## 2020-02-17 NOTE — Transfer of Care (Signed)
Immediate Anesthesia Transfer of Care Note  Patient: Emily Warren  Procedure(s) Performed: XI ROBOTIC ASSISTED LAPAROSCOPIC CHOLECYSTECTOMY (N/A Abdomen)  Patient Location: PACU  Anesthesia Type:General  Level of Consciousness: awake, alert  and oriented  Airway & Oxygen Therapy: Patient Spontanous Breathing and Patient connected to nasal cannula oxygen  Post-op Assessment: Report given to RN and Post -op Vital signs reviewed and stable  Post vital signs: Reviewed and stable  Last Vitals:  Vitals Value Taken Time  BP 129/77 02/17/20 1511  Temp 36.3   Pulse 90 02/17/20 1512  Resp 15 02/17/20 1512  SpO2 97 % 02/17/20 1512  Vitals shown include unvalidated device data.  Last Pain:  Vitals:   02/17/20 1211  TempSrc:   PainSc: 3          Complications: No complications documented.

## 2020-02-17 NOTE — ED Notes (Signed)
AAOx3.  Skin warm and dry.  Had an episode of emesis, but patient states nausea has improved as pain level has gone down.  Continue to monitor.  Warm blankets given.

## 2020-02-17 NOTE — Anesthesia Procedure Notes (Signed)
Procedure Name: Intubation Date/Time: 02/17/2020 1:37 PM Performed by: Estanislado Emms, CRNA Pre-anesthesia Checklist: Patient identified, Patient being monitored, Timeout performed, Emergency Drugs available and Suction available Patient Re-evaluated:Patient Re-evaluated prior to induction Oxygen Delivery Method: Circle system utilized Preoxygenation: Pre-oxygenation with 100% oxygen Induction Type: IV induction and Rapid sequence Laryngoscope Size: Miller and 2 Grade View: Grade I Tube type: Oral Tube size: 6.5 mm Number of attempts: 1 Airway Equipment and Method: Stylet Placement Confirmation: ETT inserted through vocal cords under direct vision,  positive ETCO2 and breath sounds checked- equal and bilateral Secured at: 21 cm Tube secured with: Tape Dental Injury: Teeth and Oropharynx as per pre-operative assessment

## 2020-02-17 NOTE — H&P (Signed)
SURGICAL CONSULTATION NOTE   HISTORY OF PRESENT ILLNESS (HPI):  44 y.o. female presented to [facility] ED for evaluation of abdominal pian starting last night. Patient reports pain localized in the right upper quadrant. Pain does not radiates to other part of the body. Pain 9/10. There has been no alleviating or aggravating factors. Reports associated nausea or vomiting.  Denies fever.  At ED she was found with elevated WBC count of 15,000. Abdominal ultrasound shows cholelithiasis with positive Murphy's sign. I personally evaluated the images. Common bile duct measures 3.4 mm.   Surgery is consulted by Dr. Katrinka Blazing in this context for evaluation and management of acute cholecystitis.  PAST MEDICAL HISTORY (PMH):  Past Medical History:  Diagnosis Date  . Complication of anesthesia    severe itching after anesthesia with 2 c-sections.  . Family history of cancer of extrahepatic bile ducts   . Family history of ovarian cancer    1/20 cancer genetic testing letter sent  . Gestational diabetes      PAST SURGICAL HISTORY [facility]):  Past Surgical History:  Procedure Laterality Date  . CESAREAN SECTION  2007 & 2010   x 2  . CESAREAN SECTION    . CESAREAN SECTION WITH BILATERAL TUBAL LIGATION N/A 09/03/2016   Procedure: CESAREAN SECTION WITH BILATERAL TUBAL LIGATION;  Surgeon: Nadara Mustard, MD;  Location: [facility] ORS;  Service: Obstetrics;  Laterality: N/A;  . WISDOM TOOTH EXTRACTION  1997   x 4     MEDICATIONS:  Prior to Admission medications   Medication Sig Start Date End Date Taking? Authorizing Provider  flintstones complete (FLINTSTONES) 60 MG chewable tablet Chew 2 tablets by mouth daily.    [provider]     ALLERGIES:  No Known Allergies   SOCIAL HISTORY:  Social History   Socioeconomic History  . Marital status: Married    Spouse name: Not on file  . Number of children: Not on file  . Years of education: Not on file  . Highest education level: Not on file   Occupational History  . Not on file  Tobacco Use  . Smoking status: Former Smoker    Quit date: 09/03/2003    Years since quitting: 16.4  . Smokeless tobacco: Never Used  Vaping Use  . Vaping Use: Never used  Substance and Sexual Activity  . Alcohol use: Yes    Alcohol/week: 0.0 standard drinks    Comment: occ  . Drug use: No  . Sexual activity: Yes    Birth control/protection: Surgical  Other Topics Concern  . Not on file  Social History Narrative  . Not on file   Social Determinants of Health   Financial Resource Strain: Not on file  Food Insecurity: Not on file  Transportation Needs: Not on file  Physical Activity: Not on file  Stress: Not on file  Social Connections: Not on file  Intimate Partner Violence: Not on file     FAMILY HISTORY:  Family History  Problem Relation Age of Onset  . Urolithiasis Brother   . Cancer Mother 55       bile duct  . Diabetes Mother   . Depression Mother   . Urolithiasis Mother   . Diabetes Maternal Grandfather   . Uterine cancer Maternal Aunt 70  . Ovarian cancer Paternal Aunt 73  . Brain cancer Paternal Uncle      REVIEW OF SYSTEMS:  Constitutional: denies weight loss, fever, chills, or sweats  Eyes: denies any other vision changes, history  of eye injury  ENT: denies sore throat, hearing problems  Respiratory: denies shortness of breath, wheezing  Cardiovascular: denies chest pain, palpitations  Gastrointestinal: positive abdominal pain, nausea and vomitnig Genitourinary: denies burning with urination or urinary frequency Musculoskeletal: denies any other joint pains or cramps  Skin: denies any other rashes or skin discolorations  Neurological: denies any other headache, dizziness, weakness  Psychiatric: denies any other depression, anxiety   All other review of systems were negative   VITAL SIGNS:  Temp:  [98.3 F (36.8 C)-99.2 F (37.3 C)] 98.3 F (36.8 C) (12/11 0850) Pulse Rate:  [57-76] 65 (12/11 0850) Resp:   [16-18] 16 (12/11 0850) BP: (125-150)/(80-87) 125/80 (12/11 0850) SpO2:  [99 %-100 %] 99 % (12/11 0850) Weight:  [102.1 kg] 102.1 kg (12/11 0427)     Height: 5\' 5"  (165.1 cm) Weight: 102.1 kg BMI (Calculated): 37.44   INTAKE/OUTPUT:  This shift: No intake/output data recorded.  Last 2 shifts: @IOLAST2SHIFTS @   PHYSICAL EXAM:  Constitutional:  -- Normal body habitus  -- Awake, alert, and oriented x3  Eyes:  -- Pupils equally round and reactive to light  -- No scleral icterus  Ear, nose, and throat:  -- No jugular venous distension  Pulmonary:  -- No crackles  -- Equal breath sounds bilaterally -- Breathing non-labored at rest Cardiovascular:  -- S1, S2 present  -- No pericardial rubs Gastrointestinal:  -- Abdomen soft, tender in right upper quadrant, non distended, no guarding or rebound tenderness -- No abdominal masses appreciated, pulsatile or otherwise  Musculoskeletal and Integumentary:  -- Wounds: None appreciated -- Extremities: B/L UE and LE FROM, hands and feet warm, no edema  Neurologic:  -- Motor function: intact and symmetric -- Sensation: intact and symmetric   Labs:  CBC Latest Ref Rng & Units 02/17/2020 09/04/2016 09/02/2016  WBC 4.0 - 10.5 K/uL 15.2(H) 13.6(H) 11.0  Hemoglobin 12.0 - 15.0 g/dL 09/06/2016 11.4(L) 12.9  Hematocrit 36.0 - 46.0 % 40.2 33.1(L) 37.4  Platelets 150 - 400 K/uL 296 216 243   CMP Latest Ref Rng & Units 02/17/2020 09/04/2016 12/18/2014  Glucose 70 - 99 mg/dL 09/06/2016) 79 02/17/2015)  BUN 6 - 20 mg/dL 11 - 12  Creatinine 664(Q - 1.00 mg/dL 034(V - 4.25  Sodium 9.56 - 145 mmol/L 136 - 140  Potassium 3.5 - 5.1 mmol/L 3.8 - 2.9(LL)  Chloride 98 - 111 mmol/L 102 - 107  CO2 22 - 32 mmol/L 25 - 20(L)  Calcium 8.9 - 10.3 mg/dL 9.2 - 9.4  Total Protein 6.5 - 8.1 g/dL 7.7 - 7.2  Total Bilirubin 0.3 - 1.2 mg/dL 3.87) - 1.4(H)  Alkaline Phos 38 - 126 U/L 93 - 78  AST 15 - 41 U/L 22 - 50(H)  ALT 0 - 44 U/L 28 - 51    Imaging studies:   EXAM: ULTRASOUND ABDOMEN LIMITED RIGHT UPPER QUADRANT  COMPARISON:  None.  FINDINGS: Gallbladder:  No wall thickening visualized. Mildly hydropic with intraluminal calculi measuring up to 0.7 cm within the gallbladder neck. Sonographic 564 sign was present.  Common bile duct:  Diameter: 3.7 mm  Liver:  No focal lesion identified. Within normal limits in parenchymal echogenicity. Portal vein is patent on color Doppler imaging with normal direction of blood flow towards the liver.  Other: None.  IMPRESSION: Mildly hydropic gallbladder and cholelithiasis. Sonographic 3.3(I sign was present.  No wall thickening, pericholecystic free fluid or biliary dilatation.   Electronically Signed   By: Eulah Pont.D.  On: 02/17/2020 05:59  Assessment/Plan:  44 y.o. female with acute cholecystitis.  Patient with history, physical exam with acute cholecystitis. Ultrasound does not shows gallbladder wall thickening or pericholecystic fluid but with the elevated WBC count, and the positive Murphy's sign with stone in the gallbladder this is more consistent with cholecystitis. Patient oriented about diagnosis and surgical management as treatment.   Discussed the risk of surgery including post-op infxn, seroma, biloma, chronic pain, poor-delayed wound healing, retained gallstone, conversion to open procedure, post-op SBO or ileus, and need for additional procedures to address said risks.  The risks of general anesthetic including MI, CVA, sudden death or even reaction to anesthetic medications also discussed. Alternatives include continued observation.  Benefits include possible symptom relief, prevention of complications including acute cholecystitis, pancreatitis.  Gae Gallop, MD

## 2020-02-17 NOTE — ED Provider Notes (Signed)
[facility] Emergency Department Provider Note ____________________________________________   Event Date/Time   First MD Initiated Contact with Patient 02/17/20 1009     (approximate)  I have reviewed the triage vital signs and the nursing notes.  HISTORY  Chief Complaint Abdominal Pain   HPI Emily Warren is a 44 y.o. femalewho presents to the ED for evaluation of abdominal pain.   Chart review indicates patient is s/p cesarean section x3, tubal ligation and endometrial ablation.  Patient reports a few months of intermittent epigastric pain with associated nausea that radiates to her back, acutely worsening last night to severe 9/10 intensity epigastric pain radiating to her back, aching intensity.  Associated nausea and vomiting of countless nonbloody nonbilious episodes of emesis.  She denies any fever, syncope, productive cough, dysuria, diarrhea or lower abdominal pain.   Past Medical History:  Diagnosis Date  . Complication of anesthesia    severe itching after anesthesia with 2 c-sections.  . Family history of cancer of extrahepatic bile ducts   . Family history of ovarian cancer    1/20 cancer genetic testing letter sent  . Gestational diabetes     Patient Active Problem List   Diagnosis Date Noted  . Acute cholecystitis 02/17/2020  . Right-sided low back pain without sciatica 12/21/2016  . Family history of ovarian cancer 10/15/2016  . History of tubal ligation 09/16/2016  . History of cesarean delivery 05/15/2016  . Ganglion 08/16/2009    Past Surgical History:  Procedure Laterality Date  . CESAREAN SECTION  2007 & 2010   x 2  . CESAREAN SECTION    . CESAREAN SECTION WITH BILATERAL TUBAL LIGATION N/A 09/03/2016   Procedure: CESAREAN SECTION WITH BILATERAL TUBAL LIGATION;  Surgeon: Nadara Mustard, MD;  Location: [facility] ORS;  Service: Obstetrics;  Laterality: N/A;  . WISDOM TOOTH EXTRACTION  1997   x 4    Prior to Admission  medications   Medication Sig Start Date End Date Taking? Authorizing Provider  flintstones complete (FLINTSTONES) 60 MG chewable tablet Chew 2 tablets by mouth daily.   Yes [provider]    Allergies Patient has no known allergies.  Family History  Problem Relation Age of Onset  . Urolithiasis Brother   . Cancer Mother 25       bile duct  . Diabetes Mother   . Depression Mother   . Urolithiasis Mother   . Diabetes Maternal Grandfather   . Uterine cancer Maternal Aunt 70  . Ovarian cancer Paternal Aunt 73  . Brain cancer Paternal Uncle     Social History Social History   Tobacco Use  . Smoking status: Former Smoker    Quit date: 09/03/2003    Years since quitting: 16.4  . Smokeless tobacco: Never Used  Vaping Use  . Vaping Use: Never used  Substance Use Topics  . Alcohol use: Yes    Alcohol/week: 0.0 standard drinks    Comment: occ  . Drug use: No    Review of Systems  Constitutional: No fever/chills Eyes: No visual changes. ENT: No sore throat. Cardiovascular: Denies chest pain. Respiratory: Denies shortness of breath. Gastrointestinal: Positive for abdominal pain, nausea and vomiting.  No diarrhea.  No constipation. Genitourinary: Negative for dysuria. Musculoskeletal: Positive for back pain. Skin: Negative for rash. Neurological: Negative for headaches, focal weakness or numbness.  ____________________________________________   PHYSICAL EXAM:  VITAL SIGNS: Vitals:   02/17/20 0742 02/17/20 0850  BP: (!) 150/87 125/80  Pulse: (!) 57  65  Resp: 16 16  Temp:  98.3 F (36.8 C)  SpO2: 100% 99%      Constitutional: Alert and oriented.  No acute distress.  Appears uncomfortable.. Eyes: Conjunctivae are normal. PERRL. EOMI. Head: Atraumatic. Nose: No congestion/rhinnorhea. Mouth/Throat: Mucous membranes are moist.  Oropharynx non-erythematous. Neck: No stridor. No cervical spine tenderness to palpation. Cardiovascular: Normal rate, regular  rhythm. Grossly normal heart sounds.  Good peripheral circulation. Respiratory: Normal respiratory effort.  No retractions. Lungs CTAB. Gastrointestinal: Soft , nondistended. No CVA tenderness. RUQ tenderness to palpation with voluntary guarding.  Abdomen is otherwise benign. Musculoskeletal: No lower extremity tenderness nor edema.  No joint effusions. No signs of acute trauma. Neurologic:  Normal speech and language. No gross focal neurologic deficits are appreciated. No gait instability noted. Skin:  Skin is warm, dry and intact. No rash noted. Psychiatric: Mood and affect are normal. Speech and behavior are normal.  ____________________________________________   LABS (all labs ordered are listed, but only abnormal results are displayed)  Labs Reviewed  COMPREHENSIVE METABOLIC PANEL - Abnormal; Notable for the following components:      Result Value   Glucose, Bld 142 (*)    Total Bilirubin 1.3 (*)    All other components within normal limits  CBC - Abnormal; Notable for the following components:   WBC 15.2 (*)    All other components within normal limits  URINALYSIS, COMPLETE (UACMP) WITH MICROSCOPIC - Abnormal; Notable for the following components:   Color, Urine YELLOW (*)    APPearance HAZY (*)    Glucose, UA 50 (*)    Bilirubin Urine SMALL (*)    Ketones, ur 5 (*)    Leukocytes,Ua TRACE (*)    Bacteria, UA RARE (*)    All other components within normal limits  RESP PANEL BY RT-PCR (FLU A&B, COVID) ARPGX2  LIPASE, BLOOD  HIV ANTIBODY (ROUTINE TESTING W REFLEX)  TROPONIN I (HIGH SENSITIVITY)   ____________________________________________  12 Lead EKG  Sinus rhythm, rate of 67 bpm.  Normal axis and normal intervals.  No evidence of acute ischemia. ____________________________________________  RADIOLOGY  ED MD interpretation: 2 view CXR reviewed by me without evidence of acute cardiopulmonary pathology. RUQ ultrasound reviewed by me with cholelithiasis  Official  radiology report(s): DG Chest 2 View  Result Date: 02/17/2020 CLINICAL DATA:  Initial evaluation for acute epigastric pain. EXAM: CHEST - 2 VIEW COMPARISON:  None available. FINDINGS: The cardiac and mediastinal silhouettes are within normal limits. The lungs are normally inflated. No airspace consolidation, pleural effusion, or pulmonary edema. No pneumothorax. No acute osseous abnormality. IMPRESSION: No active cardiopulmonary disease. Electronically Signed   By: Rise Mu M.D.   On: 02/17/2020 04:56   US Abdomen Limited RUQ (LIVER/GB)  Result Date: 02/17/2020 CLINICAL DATA:  Upper abdominal pain. EXAM: ULTRASOUND ABDOMEN LIMITED RIGHT UPPER QUADRANT COMPARISON:  None. FINDINGS: Gallbladder: No wall thickening visualized. Mildly hydropic with intraluminal calculi measuring up to 0.7 cm within the gallbladder neck. Sonographic Eulah Pont sign was present. Common bile duct: Diameter: 3.7 mm Liver: No focal lesion identified. Within normal limits in parenchymal echogenicity. Portal vein is patent on color Doppler imaging with normal direction of blood flow towards the liver. Other: None. IMPRESSION: Mildly hydropic gallbladder and cholelithiasis. Sonographic Eulah Pont sign was present. No wall thickening, pericholecystic free fluid or biliary dilatation. Electronically Signed   By: Stana Bunting M.D.   On: 02/17/2020 05:59    ____________________________________________   PROCEDURES and INTERVENTIONS  Procedure(s) performed (including Critical Care):  Marland Kitchen  1-3 Lead EKG Interpretation Performed by: Delton Prairie, MD Authorized by: Delton Prairie, MD     Interpretation: normal     ECG rate:  66   ECG rate assessment: normal     Rhythm: sinus rhythm     Ectopy: none     Conduction: normal      Medications  piperacillin-tazobactam (ZOSYN) IVPB 3.375 g (has no administration in time range)  acetaminophen (TYLENOL) tablet 650 mg (has no administration in time range)    Or   acetaminophen (TYLENOL) suppository 650 mg (has no administration in time range)  HYDROcodone-acetaminophen (NORCO/VICODIN) 5-325 MG per tablet 1-2 tablet (has no administration in time range)  morphine 4 MG/ML injection 4 mg (has no administration in time range)  ondansetron (ZOFRAN-ODT) disintegrating tablet 4 mg (has no administration in time range)    Or  ondansetron (ZOFRAN) injection 4 mg (has no administration in time range)  pantoprazole (PROTONIX) injection 40 mg (has no administration in time range)  indocyanine green (IC-GREEN) injection 1.25 mg (has no administration in time range)  enoxaparin (LOVENOX) injection 50 mg (has no administration in time range)  ondansetron (ZOFRAN-ODT) disintegrating tablet 4 mg (4 mg Oral Given 02/17/20 0609)  ketorolac (TORADOL) 30 MG/ML injection 30 mg (30 mg Intravenous Given 02/17/20 0744)    ____________________________________________   MDM / ED COURSE   44 year old female presents to the ED with evidence of acute cholecystitis requiring surgical admission for cholecystectomy.  Normal vitals on room air.  Exam with isolated tenderness to her RUQ with some intermittent voluntary guarding, abdomen is otherwise benign and does not have any peritoneal features.  Otherwise no evidence of acute pathology.  Blood work with leukocytosis, but no evidence of biliary obstruction.  CXR shows no infiltrates or evidence of cardiopulmonary disease.  EKG is nonischemic.  Repeat ultrasound shows cholelithiasis with some signs of cholecystitis, as likely source of her symptoms.  Discussed the case with general surgery on-call, who agrees to see the patient and take the patient to the OR later today for cholecystectomy.  Admitted to their service for further work-up management.  Zosyn provided prior to admission.      ____________________________________________   FINAL CLINICAL IMPRESSION(S) / ED DIAGNOSES  Final diagnoses:  Epigastric pain  Calculus of  gallbladder with acute cholecystitis without obstruction     ED Discharge Orders    None       Bianka Liberati   Note:  This document was prepared using Dragon voice recognition software and may include unintentional dictation errors.   Delton Prairie, MD 02/17/20 1141

## 2020-02-17 NOTE — ED Triage Notes (Signed)
Patient with complaint of upper abdominal pain that is radiating to her back. Patient states that started about 18:00 last night. Patient states that she tool OTC medications and that caused her to vomit.

## 2020-02-18 LAB — HIV ANTIBODY (ROUTINE TESTING W REFLEX): HIV Screen 4th Generation wRfx: NONREACTIVE

## 2020-02-18 MED ORDER — HYDROCODONE-ACETAMINOPHEN 5-325 MG PO TABS: 1.0000 | ORAL_TABLET | ORAL | 0 refills | Status: AC | PRN

## 2020-02-18 NOTE — Discharge Instructions (Signed)

## 2020-02-18 NOTE — Discharge Summary (Signed)
  Patient ID: Emily Warren MRN: [id number] DOB/AGE: May 09, 1975 43 y.o.  Admit date: 02/17/2020 Discharge date: 02/18/2020   Discharge Diagnoses:  Active Problems:   Acute cholecystitis   Procedures: Robotic assisted laparoscopic cholecystectomy  Hospital Course: Patient with acute cholecystitis.  She underwent robotic assisted laparoscopic cholecystectomy.  She tolerated the procedure well.  The pain is controlled.  Patient tolerating diet.  Patient ambulating.  Physical Exam Cardiovascular:     Rate and Rhythm: Normal rate and regular rhythm.  Pulmonary:     Effort: Pulmonary effort is normal.  Abdominal:     General: Abdomen is flat. Bowel sounds are normal.     Palpations: Abdomen is soft.  Neurological:     Mental Status: She is alert and oriented to person, place, and time.   Wounds are dry and clean   Consults: None  Disposition: Discharge disposition: 01-Home or Self Care       Discharge Instructions    Diet - low sodium heart healthy   Complete by: As directed    Increase activity slowly   Complete by: As directed      Allergies as of 02/18/2020   No Known Allergies     Medication List    TAKE these medications   flintstones complete 60 MG chewable tablet Chew 2 tablets by mouth daily.   HYDROcodone-acetaminophen 5-325 MG tablet Commonly known as: Norco Take 1 tablet by mouth every 4 (four) hours as needed for up to 3 days for moderate pain.       Follow-up Information    Carolan Shiver, MD Follow up in 2 week(s).   Specialty: General Surgery Contact information: [address] [place] [address] [phone]

## 2020-02-20 LAB — SURGICAL PATHOLOGY

## 2020-03-06 ENCOUNTER — Telehealth: Payer: Self-pay | Admitting: Adult Health

## 2020-07-08 ENCOUNTER — Other Ambulatory Visit (HOSPITAL_COMMUNITY)
Admission: RE | Admit: 2020-07-08 | Discharge: 2020-07-08 | Disposition: A | Discharge status: Home / Self Care | Payer: Medicaid Other | Source: Ambulatory Visit | Attending: Obstetrics & Gynecology | Admitting: Obstetrics & Gynecology

## 2020-07-08 ENCOUNTER — Ambulatory Visit: Payer: BLUE CROSS/BLUE SHIELD | Admitting: Obstetrics & Gynecology

## 2020-07-08 ENCOUNTER — Encounter: Payer: Self-pay | Admitting: Obstetrics & Gynecology

## 2020-07-08 ENCOUNTER — Ambulatory Visit (INDEPENDENT_AMBULATORY_CARE_PROVIDER_SITE_OTHER): Payer: Medicaid Other | Admitting: Obstetrics & Gynecology

## 2020-07-08 ENCOUNTER — Other Ambulatory Visit: Payer: Self-pay

## 2020-07-08 VITALS — BP 120/80 | Ht 65.0 in | Wt 229.0 lb

## 2020-07-08 DIAGNOSIS — Z124 Encounter for screening for malignant neoplasm of cervix: Secondary | ICD-10-CM

## 2020-07-08 DIAGNOSIS — Z01419 Encounter for gynecological examination (general) (routine) without abnormal findings: Secondary | ICD-10-CM | POA: Diagnosis not present

## 2020-07-08 DIAGNOSIS — Z0000 Encounter for general adult medical examination without abnormal findings: Secondary | ICD-10-CM | POA: Diagnosis not present

## 2020-07-08 DIAGNOSIS — Z1231 Encounter for screening mammogram for malignant neoplasm of breast: Secondary | ICD-10-CM | POA: Diagnosis not present

## 2020-07-08 NOTE — Patient Instructions (Signed)
PAP every three years Mammogram every year    Call [phone] to schedule at [facility] yearly (with PCP)  Thank you for choosing [facility]. As part of our ongoing efforts to improve patient experience, we would appreciate your feedback. Please fill out the short survey that you will receive by mail or MyChart. Your opinion is important to us! - Dr. Jayveon Convey   

## 2020-07-08 NOTE — Progress Notes (Signed)
HPI:      [name] is a 45 y.o. [id number] who LMP was Patient's last menstrual period was 07/02/2020., she presents today for her annual examination.  Periods MUCH IMPROVED after Ablation 3 yrs ago.  Monthly, 1-2 days, lighter.  Pt also has occas LOU w full bladder and stress.   The patient is sexually active. Her last pap: approximate date 3 yrs ago and was normal and last mammogram: approximate date 4 yrs ago and was normal. The patient does perform self breast exams.  There is no notable family history of breast or ovarian cancer in her family.  The patient has regular exercise: yes.  The patient denies current symptoms of depression.    GYN History: Contraception: tubal ligation  PMHx: Past Medical History:  Diagnosis Date  . Complication of anesthesia    severe itching after anesthesia with 2 c-sections.  . Family history of cancer of extrahepatic bile ducts   . Family history of ovarian cancer    1/20 cancer genetic testing letter sent  . Gestational diabetes    Past Surgical History:  Procedure Laterality Date  . CESAREAN SECTION  2007 & 2010   x 2  . CESAREAN SECTION    . CESAREAN SECTION WITH BILATERAL TUBAL LIGATION N/A 09/03/2016   Procedure: CESAREAN SECTION WITH BILATERAL TUBAL LIGATION;  Surgeon: Nadara Mustard, MD;  Location: [facility] ORS;  Service: Obstetrics;  Laterality: N/A;  . DG CHOLECYSTOGRAPHY GALL BLADDER ([facility] HX)    . WISDOM TOOTH EXTRACTION  1997   x 4   Family History  Problem Relation Age of Onset  . Urolithiasis Brother   . Cancer Mother 38       bile duct  . Diabetes Mother   . Depression Mother   . Urolithiasis Mother   . Diabetes Maternal Grandfather   . Uterine cancer Maternal Aunt 70  . Ovarian cancer Paternal Aunt 47  . Brain cancer Paternal Uncle    Social History   Tobacco Use  . Smoking status: Former Smoker    Quit date: 09/03/2003    Years since quitting: 16.8  . Smokeless tobacco: Never Used  Vaping Use  . Vaping  Use: Never used  Substance Use Topics  . Alcohol use: Yes    Alcohol/week: 0.0 standard drinks    Comment: occ  . Drug use: No    Current Outpatient Medications:  .  Emollient (COLLAGEN EX), Apply topically., Disp: , Rfl:  .  flintstones complete (FLINTSTONES) 60 MG chewable tablet, Chew 2 tablets by mouth daily., Disp: , Rfl:  Allergies: Patient has no known allergies.  Review of Systems  Constitutional: Negative for chills, fever and malaise/fatigue.  HENT: Negative for congestion, sinus pain and sore throat.   Eyes: Negative for blurred vision and pain.  Respiratory: Negative for cough and wheezing.   Cardiovascular: Negative for chest pain and leg swelling.  Gastrointestinal: Negative for abdominal pain, constipation, diarrhea, heartburn, nausea and vomiting.  Genitourinary: Negative for dysuria, frequency, hematuria and urgency.  Musculoskeletal: Negative for back pain, joint pain, myalgias and neck pain.  Skin: Negative for itching and rash.  Neurological: Negative for dizziness, tremors and weakness.  Endo/Heme/Allergies: Does not bruise/bleed easily.  Psychiatric/Behavioral: Negative for depression. The patient is not nervous/anxious and does not have insomnia.     Objective: BP 120/80   Ht 5\' 5"  (1.651 m)   Wt 229 lb (103.9 kg)   LMP 07/02/2020   BMI 38.11 kg/m  Filed Weights   07/08/20 0950  Weight: 229 lb (103.9 kg)   Body mass index is 38.11 kg/m. Physical Exam Constitutional:      General: She is not in acute distress.    Appearance: She is well-developed.  Genitourinary:     Rectum normal.     No lesions in the vagina.     No vaginal bleeding.      Right Adnexa: not tender and no mass present.    Left Adnexa: not tender and no mass present.    No cervical motion tenderness, friability, lesion or polyp.     Uterus is not enlarged.     No uterine mass detected. Breasts:     Right: No mass, skin change or tenderness.     Left: No mass, skin change  or tenderness.    HENT:     Head: Normocephalic and atraumatic. No laceration.     Right Ear: Hearing normal.     Left Ear: Hearing normal.     Mouth/Throat:     Pharynx: Uvula midline.  Eyes:     Pupils: Pupils are equal, round, and reactive to light.  Neck:     Thyroid: No thyromegaly.  Cardiovascular:     Rate and Rhythm: Normal rate and regular rhythm.     Heart sounds: No murmur heard. No friction rub. No gallop.   Pulmonary:     Effort: Pulmonary effort is normal. No respiratory distress.     Breath sounds: Normal breath sounds. No wheezing.  Abdominal:     General: Bowel sounds are normal. There is no distension.     Palpations: Abdomen is soft.     Tenderness: There is no abdominal tenderness. There is no rebound.  Musculoskeletal:        General: Normal range of motion.     Cervical back: Normal range of motion and neck supple.  Neurological:     Mental Status: She is alert and oriented to person, place, and time.     Cranial Nerves: No cranial nerve deficit.  Skin:    General: Skin is warm and dry.  Psychiatric:        Judgment: Judgment normal.  Vitals reviewed.     Assessment:  ANNUAL EXAM 1. Women's annual routine gynecological examination   2. Encounter for screening mammogram for malignant neoplasm of breast   3. Screening for cervical cancer      Screening Plan:            1.  Cervical Screening-  Pap smear done today  2. Breast screening- Exam annually and mammogram>40 planned   3. Colonoscopy every 10 years, Hemoccult testing - after age 65  4. Labs managed by PCP  5. Counseling for contraception: bilateral tubal ligation     F/U  Return in about 1 year (around 07/08/2021) for Annual.  Annamarie Major, MD, [facility], [facility] 07/08/2020  10:12 AM

## 2020-07-09 LAB — CYTOLOGY - PAP
Adequacy: ABSENT
Comment: NEGATIVE
Diagnosis: NEGATIVE
High risk HPV: NEGATIVE

## 2020-07-12 ENCOUNTER — Other Ambulatory Visit: Payer: Self-pay

## 2020-07-12 ENCOUNTER — Ambulatory Visit
Admission: RE | Admit: 2020-07-12 | Discharge: 2020-07-12 | Disposition: A | Discharge status: Home / Self Care | Payer: Medicaid Other | Source: Ambulatory Visit | Attending: Obstetrics & Gynecology | Admitting: Obstetrics & Gynecology

## 2020-07-12 DIAGNOSIS — Z1231 Encounter for screening mammogram for malignant neoplasm of breast: Secondary | ICD-10-CM | POA: Diagnosis not present

## 2020-07-15 ENCOUNTER — Encounter: Payer: Self-pay | Admitting: Obstetrics and Gynecology

## 2021-04-10 ENCOUNTER — Ambulatory Visit (INDEPENDENT_AMBULATORY_CARE_PROVIDER_SITE_OTHER): Payer: Medicaid Other | Admitting: Obstetrics and Gynecology

## 2021-04-10 ENCOUNTER — Encounter: Payer: Self-pay | Admitting: Obstetrics and Gynecology

## 2021-04-10 ENCOUNTER — Other Ambulatory Visit: Payer: Self-pay

## 2021-04-10 VITALS — BP 120/80 | Ht 65.0 in | Wt 218.0 lb

## 2021-04-10 DIAGNOSIS — F419 Anxiety disorder, unspecified: Secondary | ICD-10-CM

## 2021-04-10 DIAGNOSIS — Z113 Encounter for screening for infections with a predominantly sexual mode of transmission: Secondary | ICD-10-CM

## 2021-04-10 DIAGNOSIS — N898 Other specified noninflammatory disorders of vagina: Secondary | ICD-10-CM | POA: Diagnosis not present

## 2021-04-10 LAB — POCT WET PREP WITH KOH
Clue Cells Wet Prep HPF POC: NEGATIVE
KOH Prep POC: NEGATIVE
Trichomonas, UA: NEGATIVE
Yeast Wet Prep HPF POC: NEGATIVE

## 2021-04-10 MED ORDER — ALPRAZOLAM 0.25 MG PO TABS: 0.2500 mg | ORAL_TABLET | Freq: Two times a day (BID) | ORAL | 0 refills | Status: DC | PRN

## 2021-04-10 NOTE — Progress Notes (Signed)
Nadara Mustard, MD   Chief Complaint  Patient presents with   Vaginal Discharge    Abnormal odor, no itchiness or irritation. On/off x few months     HPI:      [name] is a 46 y.o. [id number] whose LMP was Patient's last menstrual period was 03/21/2021 (approximate)., presents today for vaginal odor intermittently for the past few months. Odor is more "BO" than fishy. Has had increased sweating, as well. Sx don't resolve with washing. Minimal vag d/c, no irritation. Was on abx a month ago, after sx had already started. No meds to treat. Is taking probiotics. Wonders if it's hormonal. No urin sx, no LBP, no pelvic pain, no fevers. Menses are monthly, lasting 3-5 days, normal for her.  Is under significant stress with fam issues, with increased anxiety. Has lost 20# due to decreased appetite, nausea. Denies depression sx. Tried a xanax with some improvement. Doesn't feel like she needs something daily due to situational sx.  She is sex active, no new partners.    Patient Active Problem List   Diagnosis Date Noted   Acute cholecystitis 02/17/2020   Right-sided low back pain without sciatica 12/21/2016   Family history of ovarian cancer 10/15/2016   History of tubal ligation 09/16/2016   History of cesarean delivery 05/15/2016   Ganglion 08/16/2009    Past Surgical History:  Procedure Laterality Date   CESAREAN SECTION  2007 & 2010   x 2   CESAREAN SECTION     CESAREAN SECTION WITH BILATERAL TUBAL LIGATION N/A 09/03/2016   Procedure: CESAREAN SECTION WITH BILATERAL TUBAL LIGATION;  Surgeon: Nadara Mustard, MD;  Location: [facility] ORS;  Service: Obstetrics;  Laterality: N/A;   DG CHOLECYSTOGRAPHY GALL BLADDER ([facility] HX)     WISDOM TOOTH EXTRACTION  1997   x 4    Family History  Problem Relation Age of Onset   Urolithiasis Brother    Cancer Mother 45       bile duct   Diabetes Mother    Depression Mother    Urolithiasis Mother    Diabetes Maternal Grandfather     Uterine cancer Maternal Aunt 33   Ovarian cancer Paternal Aunt 7   Brain cancer Paternal Uncle     Social History   Socioeconomic History   Marital status: Married    Spouse name: Not on file   Number of children: Not on file   Years of education: Not on file   Highest education level: Not on file  Occupational History   Not on file  Tobacco Use   Smoking status: Former    Types: Cigarettes    Quit date: 09/03/2003    Years since quitting: 17.6   Smokeless tobacco: Never  Vaping Use   Vaping Use: Never used  Substance and Sexual Activity   Alcohol use: Yes    Alcohol/week: 0.0 standard drinks    Comment: occ   Drug use: No   Sexual activity: Yes    Birth control/protection: Surgical    Comment: Ablation  Other Topics Concern   Not on file  Social History Narrative   Not on file   Social Determinants of Health   Financial Resource Strain: Not on file  Food Insecurity: Not on file  Transportation Needs: Not on file  Physical Activity: Not on file  Stress: Not on file  Social Connections: Not on file  Intimate Partner Violence: Not on file    Outpatient Medications Prior  to Visit  Medication Sig Dispense Refill   Probiotic Product (PROBIOTIC PO) Take by mouth.     VITAMIN E PO Take by mouth.     VITAMIN K PO Take by mouth.     Emollient (COLLAGEN EX) Apply topically.     flintstones complete (FLINTSTONES) 60 MG chewable tablet Chew 2 tablets by mouth daily.     No facility-administered medications prior to visit.    ROS:  Review of Systems  Constitutional:  Negative for fever.  Gastrointestinal:  Negative for blood in stool, constipation, diarrhea, nausea and vomiting.  Genitourinary:  Negative for dyspareunia, dysuria, flank pain, frequency, hematuria, urgency, vaginal bleeding, vaginal discharge and vaginal pain.  Musculoskeletal:  Negative for back pain.  Skin:  Negative for rash.  Psychiatric/Behavioral:  Positive for agitation.   BREAST: No  symptoms   OBJECTIVE:   Vitals:  BP 120/80    Ht 5\' 5"  (1.651 m)    Wt 218 lb (98.9 kg)    LMP 03/21/2021 (Approximate)    BMI 36.28 kg/m   Physical Exam Vitals reviewed.  Constitutional:      Appearance: She is well-developed.  Pulmonary:     Effort: Pulmonary effort is normal.  Genitourinary:    General: Normal vulva.     Pubic Area: No rash.      Labia:        Right: No rash, tenderness or lesion.        Left: No rash, tenderness or lesion.      Vagina: Normal. No vaginal discharge, erythema or tenderness.     Cervix: Normal.     Uterus: Normal. Not enlarged and not tender.      Adnexa: Right adnexa normal and left adnexa normal.       Right: No mass or tenderness.         Left: No mass or tenderness.    Musculoskeletal:        General: Normal range of motion.     Cervical back: Normal range of motion.  Skin:    General: Skin is warm and dry.  Neurological:     General: No focal deficit present.     Mental Status: She is alert and oriented to person, place, and time.  Psychiatric:        Mood and Affect: Mood normal.        Behavior: Behavior normal.        Thought Content: Thought content normal.        Judgment: Judgment normal.    Results: Results for orders placed or performed in visit on 04/10/21 (from the past 24 hour(s))  POCT Wet Prep with KOH     Status: Normal   Collection Time: 04/10/21 12:25 PM  Result Value Ref Range   Trichomonas, UA Negative    Clue Cells Wet Prep HPF POC neg    Epithelial Wet Prep HPF POC     Yeast Wet Prep HPF POC neg    Bacteria Wet Prep HPF POC     RBC Wet Prep HPF POC     WBC Wet Prep HPF POC     KOH Prep POC Negative Negative     Assessment/Plan: Vaginal odor - Plan: POCT Wet Prep with KOH, NuSwab Vaginitis Plus (VG+), neg wet prep. Check culture. If neg, most likely due to increased sweating with anxiety. Use unscented products/corn starch prn sweat.  Screening for STD (sexually transmitted disease) - Plan: NuSwab  Vaginitis Plus (VG+)  Anxiety -  Plan: ALPRAZolam (XANAX) 0.25 MG tablet; situational. Discussed episodic tx vs SSRIs. Will try xanax for now (has taken in past with sx relief); pt aware to use sparingly. F/u prn.    Meds ordered this encounter  Medications   ALPRAZolam (XANAX) 0.25 MG tablet    Sig: Take 1 tablet (0.25 mg total) by mouth 2 (two) times daily as needed for anxiety.    Dispense:  30 tablet    Refill:  0    Order Specific Question:   Supervising Provider    Answer:   Gae Dry [id number]      Return if symptoms worsen or fail to improve.  Melanie Pellot B. Keyosha Tiedt, PA-C 04/10/2021 12:27 PM

## 2021-04-11 ENCOUNTER — Encounter: Payer: Self-pay | Admitting: Obstetrics and Gynecology

## 2021-04-13 LAB — NUSWAB VAGINITIS PLUS (VG+)
Candida albicans, NAA: POSITIVE — AB
Candida glabrata, NAA: POSITIVE — AB
Chlamydia trachomatis, NAA: NEGATIVE
Neisseria gonorrhoeae, NAA: NEGATIVE
Trich vag by NAA: NEGATIVE

## 2021-04-13 MED ORDER — FLUCONAZOLE 150 MG PO TABS: 150.0000 mg | ORAL_TABLET | Freq: Once | ORAL | 0 refills | Status: AC

## 2021-04-13 NOTE — Addendum Note (Signed)
Addended by: Althea Grimmer B on: 04/13/2021 06:08 PM   Modules accepted: Orders

## 2021-05-07 ENCOUNTER — Other Ambulatory Visit: Payer: Self-pay | Admitting: Obstetrics and Gynecology

## 2021-05-07 DIAGNOSIS — F419 Anxiety disorder, unspecified: Secondary | ICD-10-CM

## 2021-05-07 MED ORDER — ALPRAZOLAM 0.5 MG PO TABS: 0.5000 mg | ORAL_TABLET | Freq: Two times a day (BID) | ORAL | 0 refills | Status: DC | PRN

## 2021-05-14 ENCOUNTER — Encounter: Payer: Self-pay | Admitting: Obstetrics & Gynecology

## 2021-05-24 ENCOUNTER — Other Ambulatory Visit: Payer: Self-pay

## 2021-05-24 ENCOUNTER — Encounter: Payer: Self-pay | Admitting: Emergency Medicine

## 2021-05-24 ENCOUNTER — Emergency Department
Admission: EM | Admit: 2021-05-24 | Discharge: 2021-05-24 | Disposition: A | Discharge status: Home / Self Care | Payer: Medicaid Other | Attending: Emergency Medicine | Admitting: Emergency Medicine

## 2021-05-24 ENCOUNTER — Emergency Department: Payer: Medicaid Other

## 2021-05-24 DIAGNOSIS — R109 Unspecified abdominal pain: Secondary | ICD-10-CM

## 2021-05-24 DIAGNOSIS — N132 Hydronephrosis with renal and ureteral calculous obstruction: Secondary | ICD-10-CM | POA: Diagnosis not present

## 2021-05-24 DIAGNOSIS — D72829 Elevated white blood cell count, unspecified: Secondary | ICD-10-CM | POA: Diagnosis not present

## 2021-05-24 DIAGNOSIS — R8289 Other abnormal findings on cytological and histological examination of urine: Secondary | ICD-10-CM | POA: Diagnosis not present

## 2021-05-24 LAB — CBC WITH DIFFERENTIAL/PLATELET
Abs Immature Granulocytes: 0.05 10*3/uL (ref 0.00–0.07)
Basophils Absolute: 0 10*3/uL (ref 0.0–0.1)
Basophils Relative: 0 %
Eosinophils Absolute: 0.1 10*3/uL (ref 0.0–0.5)
Eosinophils Relative: 1 %
HCT: 42.1 % (ref 36.0–46.0)
Hemoglobin: 14.1 g/dL (ref 12.0–15.0)
Immature Granulocytes: 0 %
Lymphocytes Relative: 16 %
Lymphs Abs: 2.1 10*3/uL (ref 0.7–4.0)
MCH: 29.8 pg (ref 26.0–34.0)
MCHC: 33.5 g/dL (ref 30.0–36.0)
MCV: 89 fL (ref 80.0–100.0)
Monocytes Absolute: 1 10*3/uL (ref 0.1–1.0)
Monocytes Relative: 7 %
Neutro Abs: 10.1 10*3/uL — ABNORMAL HIGH (ref 1.7–7.7)
Neutrophils Relative %: 76 %
Platelets: 329 10*3/uL (ref 150–400)
RBC: 4.73 MIL/uL (ref 3.87–5.11)
RDW: 12.5 % (ref 11.5–15.5)
WBC: 13.4 10*3/uL — ABNORMAL HIGH (ref 4.0–10.5)
nRBC: 0 % (ref 0.0–0.2)

## 2021-05-24 LAB — URINALYSIS, COMPLETE (UACMP) WITH MICROSCOPIC
Bilirubin Urine: NEGATIVE
Glucose, UA: NEGATIVE mg/dL
Ketones, ur: 80 mg/dL — AB
Leukocytes,Ua: NEGATIVE
Nitrite: NEGATIVE
Protein, ur: 30 mg/dL — AB
RBC / HPF: >50 RBC/hpf — ABNORMAL HIGH (ref 0–5)
Specific Gravity, Urine: 1.026 (ref 1.005–1.030)
pH: 5 (ref 5.0–8.0)

## 2021-05-24 LAB — PREGNANCY, URINE: Preg Test, Ur: NEGATIVE

## 2021-05-24 LAB — COMPREHENSIVE METABOLIC PANEL
ALT: 30 U/L (ref 0–44)
AST: 34 U/L (ref 15–41)
Albumin: 4.2 g/dL (ref 3.5–5.0)
Alkaline Phosphatase: 83 U/L (ref 38–126)
Anion gap: 10 (ref 5–15)
BUN: 11 mg/dL (ref 6–20)
CO2: 24 mmol/L (ref 22–32)
Calcium: 9.3 mg/dL (ref 8.9–10.3)
Chloride: 106 mmol/L (ref 98–111)
Creatinine, Ser: 0.93 mg/dL (ref 0.44–1.00)
GFR, Estimated: >60 mL/min (ref >60)
Glucose, Bld: 110 mg/dL — ABNORMAL HIGH (ref 70–99)
Potassium: 3.5 mmol/L (ref 3.5–5.1)
Sodium: 140 mmol/L (ref 135–145)
Total Bilirubin: 1.9 mg/dL — ABNORMAL HIGH (ref 0.3–1.2)
Total Protein: 7.8 g/dL (ref 6.5–8.1)

## 2021-05-24 MED ORDER — ONDANSETRON HCL 4 MG PO TABS: 4.0000 mg | ORAL_TABLET | Freq: Every day | ORAL | 0 refills | Status: AC | PRN

## 2021-05-24 MED ORDER — CEPHALEXIN 500 MG PO CAPS: 500.0000 mg | ORAL_CAPSULE | Freq: Four times a day (QID) | ORAL | 0 refills | Status: AC

## 2021-05-24 MED ORDER — OXYCODONE-ACETAMINOPHEN 5-325 MG PO TABS
1.0000 | ORAL_TABLET | Freq: Once | ORAL | Status: AC
  Administered 2021-05-24: 1 via ORAL
  Filled 2021-05-24: qty 1

## 2021-05-24 MED ORDER — SODIUM CHLORIDE 0.9 % IV SOLN
1.0000 g | Freq: Once | INTRAVENOUS | Status: AC
  Administered 2021-05-24: 1 g via INTRAVENOUS
  Filled 2021-05-24: qty 10

## 2021-05-24 MED ORDER — KETOROLAC TROMETHAMINE 15 MG/ML IJ SOLN
15.0000 mg | Freq: Once | INTRAMUSCULAR | Status: AC
  Administered 2021-05-24: 15 mg via INTRAVENOUS
  Filled 2021-05-24: qty 1

## 2021-05-24 MED ORDER — HYDROMORPHONE HCL 1 MG/ML IJ SOLN
0.5000 mg | Freq: Once | INTRAMUSCULAR | Status: AC
  Administered 2021-05-24: 0.5 mg via INTRAVENOUS
  Filled 2021-05-24: qty 1

## 2021-05-24 MED ORDER — KETOROLAC TROMETHAMINE 15 MG/ML IJ SOLN: 15.0000 mg | Freq: Once | INTRAMUSCULAR | Status: DC

## 2021-05-24 MED ORDER — METOCLOPRAMIDE HCL 5 MG/ML IJ SOLN
10.0000 mg | Freq: Once | INTRAMUSCULAR | Status: AC
Start: 2021-05-24 — End: 2021-05-24
  Administered 2021-05-24: 10 mg via INTRAVENOUS
  Filled 2021-05-24: qty 2

## 2021-05-24 MED ORDER — SODIUM CHLORIDE 0.9 % IV BOLUS
1000.0000 mL | Freq: Once | INTRAVENOUS | Status: AC
  Administered 2021-05-24: 1000 mL via INTRAVENOUS

## 2021-05-24 MED ORDER — ONDANSETRON 4 MG PO TBDP
4.0000 mg | ORAL_TABLET | Freq: Once | ORAL | Status: AC
  Administered 2021-05-24: 4 mg via ORAL
  Filled 2021-05-24: qty 1

## 2021-05-24 MED ORDER — OXYCODONE HCL 5 MG PO TABS: 5.0000 mg | ORAL_TABLET | Freq: Three times a day (TID) | ORAL | 0 refills | Status: AC | PRN

## 2021-05-24 NOTE — ED Notes (Signed)
Pt having episode of emesis at this time ?

## 2021-05-24 NOTE — ED Provider Notes (Signed)
? ?[facility] ?Provider Note ? ? ? Event Date/Time  ? First MD Initiated Contact with Patient 05/24/21 1839   ?  (approximate) ? ? ?History  ? ?Flank Pain ? ? ?HPI ? ?JOLEEN STUCKERT is a 46 y.o. female with past medical history of kidney stones who presents with right flank pain.  Symptoms started 4 days ago.  She initially saw her primary care doctor who checked a urine which is overall unremarkable she got Toradol and her pain improved.  Pain came back several days later on Friday which was yesterday she returned to the PCPs office they did an x-ray that did not show a stone.  Pain was overall better until today and now she is having significant right flank pain again.  Also endorses some pressure with urination but no dysuria.  Denies fevers chills.  Has had some emesis.  Patient had a kidney stone in the past this feels similar. ?  ? ?Past Medical History:  ?Diagnosis Date  ? Complication of anesthesia   ? severe itching after anesthesia with 2 c-sections.  ? Family history of cancer of extrahepatic bile ducts   ? Family history of ovarian cancer   ? 1/20; 5/22 cancer genetic testing letter sent  ? Gestational diabetes   ? ? ?Patient Active Problem List  ? Diagnosis Date Noted  ? Acute cholecystitis 02/17/2020  ? Right-sided low back pain without sciatica 12/21/2016  ? Family history of ovarian cancer 10/15/2016  ? History of tubal ligation 09/16/2016  ? History of cesarean delivery 05/15/2016  ? Ganglion 08/16/2009  ? ? ? ?Physical Exam  ?Triage Vital Signs: ?ED Triage Vitals  ?Enc Vitals Group  ?   BP 05/24/21 1835 (!) 142/87  ?   Pulse Rate 05/24/21 1835 81  ?   Resp 05/24/21 1835 18  ?   Temp 05/24/21 1835 98.4 ?F (36.9 ?C)  ?   Temp Source 05/24/21 1835 Oral  ?   SpO2 05/24/21 1835 100 %  ?   Weight 05/24/21 1830 215 lb (97.5 kg)  ?   Height 05/24/21 1830 5\' 5"  (1.651 m)  ?   Head Circumference --   ?   Peak Flow --   ?   Pain Score 05/24/21 1830 9  ?   Pain Loc --   ?   Pain Edu?  --   ?   Excl. in GC? --   ? ? ?Most recent vital signs: ?Vitals:  ? 05/24/21 1835  ?BP: (!) 142/87  ?Pulse: 81  ?Resp: 18  ?Temp: 98.4 ?F (36.9 ?C)  ?SpO2: 100%  ? ? ? ?General: Awake, patient is tearful ?CV:  Good peripheral perfusion.  ?Resp:  Normal effort.  ?Abd:  No distention. Nontender, mild right CVA tenderness ?Neuro:             Awake, Alert, Oriented x 3  ?Other:   ? ? ?ED Results / Procedures / Treatments  ?Labs ?(all labs ordered are listed, but only abnormal results are displayed) ?Labs Reviewed  ?URINALYSIS, COMPLETE (UACMP) WITH MICROSCOPIC - Abnormal; Notable for the following components:  ?    Result Value  ? Color, Urine YELLOW (*)   ? APPearance HAZY (*)   ? Hgb urine dipstick LARGE (*)   ? Ketones, ur 80 (*)   ? Protein, ur 30 (*)   ? RBC / HPF >50 (*)   ? Bacteria, UA MANY (*)   ? All other components within normal  limits  ?CBC WITH DIFFERENTIAL/PLATELET - Abnormal; Notable for the following components:  ? WBC 13.4 (*)   ? Neutro Abs 10.1 (*)   ? All other components within normal limits  ?COMPREHENSIVE METABOLIC PANEL - Abnormal; Notable for the following components:  ? Glucose, Bld 110 (*)   ? Total Bilirubin 1.9 (*)   ? All other components within normal limits  ?URINE CULTURE  ?PREGNANCY, URINE  ? ? ? ?EKG ? ? ? ? ?RADIOLOGY ? ? ? ?PROCEDURES: ? ?Critical Care performed: No ? ?Procedures ? ?MEDICATIONS ORDERED IN ED: ?Medications  ?HYDROmorphone (DILAUDID) injection 0.5 mg (has no administration in time range)  ?metoCLOPramide (REGLAN) injection 10 mg (has no administration in time range)  ?cefTRIAXone (ROCEPHIN) 1 g in sodium chloride 0.9 % 100 mL IVPB (has no administration in time range)  ?ondansetron (ZOFRAN-ODT) disintegrating tablet 4 mg (4 mg Oral Given 05/24/21 1908)  ?ketorolac (TORADOL) 15 MG/ML injection 15 mg (15 mg Intravenous Given 05/24/21 1909)  ?sodium chloride 0.9 % bolus 1,000 mL (1,000 mLs Intravenous New Bag/Given 05/24/21 1908)  ? ? ? ?IMPRESSION / MDM / ASSESSMENT AND  PLAN / ED COURSE  ?I reviewed the triage vital signs and the nursing notes. ?             ?               ? ?Differential diagnosis includes, but is not limited to, nephrolithiasis, pyelonephritis, musculoskeletal ? ?Patient is a 46 year old female with a history of prior kidney stone presents with right flank pain x4 days with some pressure with urination.  Vital signs within normal limits.  Overall she appears somewhat uncomfortable but nontoxic has mild CVA tenderness but benign abdomen.  Apparently she has had a urine done by the PCP as well as an x-ray which was unrevealing.  UA today has greater than 50 WBCs with 11-20 WBCs, nitrite negative.  I do suspect stone based on her presentation.  We will get a CT renal study. ? ?Patient labs are otherwise notable for leukocytosis to 13.4, right main is normal at 0.93.  Patient still having significant pain after Toradol we will give a dose of Dilaudid.  With the urine and leukocytosis we will treat with a dose of Rocephin.  If no stone then presentation would likely indicate [place] and if there is a stone would want her to be on antibiotics anyway so we will give the first days of Rocephin followed by third-generation cephalosporin p.o.  Patient signed out to oncoming provider pending CT renal study and reassessment. ? ? ?Clinical Course as of 05/24/21 1942  ?Sat May 24, 2021  ?1916 WBC(!): 13.4 [KM]  ?  ?Clinical Course User Index ?[KM] Georga Hacking, MD  ? ? ? ?FINAL CLINICAL IMPRESSION(S) / ED DIAGNOSES  ? ?Final diagnoses:  ?Flank pain  ? ? ? ?Rx / DC Orders  ? ?ED Discharge Orders   ? ? None  ? ?  ? ? ? ?Note:  This document was prepared using Dragon voice recognition software and may include unintentional dictation errors. ?  ?Georga Hacking, MD ?05/24/21 1942 ? ?

## 2021-05-24 NOTE — ED Notes (Signed)
Patient transported to CT 

## 2021-05-24 NOTE — ED Triage Notes (Signed)
Pt via POV from home. Pt c/o R flank pain for the past 2 days. Pt does have a hx of kidney stones. Pt c/o nausea and pressure in her bladder. Pt has a hx of a cholecystectomy. Pt is A&OX4 and NAD ?

## 2021-05-24 NOTE — Discharge Instructions (Addendum)
Take the prescription meds as directed.  You may also start on the Flomax given by your primary provider on yesterday.  Return to the ED for worsening pain, fever, nausea, or bloody urine as discussed. ?

## 2021-05-25 NOTE — ED Provider Notes (Signed)
----------------------------------------- ?  12:02 AM on 05/25/2021 ?----------------------------------------- ? ?Blood pressure 116/65, pulse 62, temperature 98.4 ?F (36.9 ?C), temperature source Oral, resp. rate 18, height 5\' 5"  (1.651 m), weight 97.5 kg, SpO2 98 %. ? ?Assuming care from Dr. Starleen Blue, PA-C/NP-C.  In short, Emily Warren is a 46 y.o. female with a chief complaint of Flank Pain ?Marland Kitchen  Refer to the original H&P for additional details. ? ?The current plan of care is to await CT Renal Stone study and disposition appropriately. ? ?____________________________________________ ?  ?RADIOLOGY ? ? ?CT Renal Stone Study ? ?IMPRESSION: ?1. 4 mm distal RIGHT ureteral calculus causing mild to moderate ?RIGHT hydroureteronephrosis. ?2. 2 mm nonobstructing LEFT renal calculus. ?____________________________________________ ? ?PROCEDURES ? ?Percocet 5-325 mg PO ? ?Procedures ?____________________________________________ ? ?INITIAL IMPRESSION / ASSESSMENT AND PLAN / ED COURSE ? ?Patient to the ED with flank pain for the last 2 days.  Patient was evaluated in the ED and given empiric dose of Rocephin while awaiting her CT scan result.  CT did confirm a right sided 4 mm stone just above the UVJ, with some evidence of some mild right hydronephrosis.  Patient also has a 2 mm nonobstructing stone on the left.  Patient stable at this time reporting she has had spontaneous voiding of urine without difficulty since the CT scan as well as the fluid bolus.  Patient is requesting discharge at this time.  She will continue with antibiotics prescribed as well as the Flomax provided yesterday by her PCP.  She will follow-up with urology or return to the ED if discussed. ? ?Emily Warren was evaluated in Emergency Department on 05/25/2021 for the symptoms described in the history of present illness. She was evaluated in the context of the global COVID-19 pandemic, which necessitated consideration that the patient might be at risk  for infection with the SARS-CoV-2 virus that causes COVID-19. Institutional protocols and algorithms that pertain to the evaluation of patients at risk for COVID-19 are in a state of rapid change based on information released by regulatory bodies including the CDC and federal and state organizations. These policies and algorithms were followed during the patient's care in the ED. ?____________________________________________ ? ?FINAL CLINICAL IMPRESSION(S) / ED DIAGNOSES ? ?Final diagnoses:  ?Flank pain  ? ? ?  ?Melvenia Needles, PA-C ?05/25/21 0019 ? ?  ?Carrie Mew, MD ?05/27/21 1018 ? ?

## 2021-05-26 LAB — URINE CULTURE: Culture: <10000 — AB

## 2021-05-28 ENCOUNTER — Encounter: Payer: Self-pay | Admitting: Obstetrics and Gynecology

## 2021-05-30 ENCOUNTER — Other Ambulatory Visit: Payer: Self-pay | Admitting: Advanced Practice Midwife

## 2021-05-30 DIAGNOSIS — N898 Other specified noninflammatory disorders of vagina: Secondary | ICD-10-CM

## 2021-05-30 MED ORDER — FLUCONAZOLE 150 MG PO TABS: 150.0000 mg | ORAL_TABLET | Freq: Once | ORAL | 3 refills | Status: AC

## 2021-05-30 NOTE — Progress Notes (Signed)
Rx diflucan per patient request for symptoms of yeast infection. ?

## 2021-08-19 ENCOUNTER — Ambulatory Visit: Payer: Medicaid Other | Admitting: Obstetrics and Gynecology

## 2021-09-06 DIAGNOSIS — Z1371 Encounter for nonprocreative screening for genetic disease carrier status: Secondary | ICD-10-CM

## 2021-09-06 HISTORY — DX: Encounter for nonprocreative screening for genetic disease carrier status: Z13.71

## 2021-09-10 ENCOUNTER — Encounter: Payer: Self-pay | Admitting: Obstetrics and Gynecology

## 2021-09-10 ENCOUNTER — Ambulatory Visit (INDEPENDENT_AMBULATORY_CARE_PROVIDER_SITE_OTHER): Payer: Medicaid Other | Admitting: Obstetrics and Gynecology

## 2021-09-10 VITALS — BP 120/70 | Ht 65.0 in | Wt 227.0 lb

## 2021-09-10 DIAGNOSIS — Z1231 Encounter for screening mammogram for malignant neoplasm of breast: Secondary | ICD-10-CM

## 2021-09-10 DIAGNOSIS — Z800 Family history of malignant neoplasm of digestive organs: Secondary | ICD-10-CM | POA: Insufficient documentation

## 2021-09-10 DIAGNOSIS — Z0000 Encounter for general adult medical examination without abnormal findings: Secondary | ICD-10-CM

## 2021-09-10 DIAGNOSIS — Z8041 Family history of malignant neoplasm of ovary: Secondary | ICD-10-CM

## 2021-09-10 DIAGNOSIS — Z01419 Encounter for gynecological examination (general) (routine) without abnormal findings: Secondary | ICD-10-CM | POA: Diagnosis not present

## 2021-09-10 DIAGNOSIS — Z6837 Body mass index (BMI) 37.0-37.9, adult: Secondary | ICD-10-CM

## 2021-09-10 DIAGNOSIS — Z131 Encounter for screening for diabetes mellitus: Secondary | ICD-10-CM

## 2021-09-10 NOTE — Patient Instructions (Signed)
I value your feedback and you entrusting us with your care. If you get a [place] patient survey, I would appreciate you taking the time to let us know about your experience today. Thank you!  [facility]: [phone]      

## 2021-09-10 NOTE — Progress Notes (Signed)
PCP:  Pcp, No   Chief Complaint  Patient presents with   Gynecologic Exam    No concerns     HPI:      [name] is a 46 y.o. [id number] whose LMP was Patient's last menstrual period was 09/08/2021 (exact date)., presents today for her annual examination.  Her menses are regular every 28-30 days, lasting 3 days, light flow.  Dysmenorrhea mild,  She does not have intermenstrual bleeding. S/p endometrial ablation and menses much improved.  Sex activity: single partner, contraception - tubal ligation. No pain/bleeding. Last Pap: 07/08/20 Results were: no abnormalities /neg HPV DNA   Last mammogram: 07/12/20  Results were: normal--routine follow-up in 12 months There is no FH of breast cancer. There is a FH of bile duct cancer in her mom, ovarian cancer in her mat aunt and pat aunt, genetic testing not done. The patient does not do self-breast exams.  Tobacco use: The patient denies current or previous tobacco use. Alcohol use: social drinker No drug use.  Exercise: not active  She does get adequate calcium but not Vitamin D in her diet. Dr. Kenton Kingfisher did labs; lipid panel due but not fasting today. Had elevated non-fasting glucose labs in past.   Has some anxiety issues due to fam stressors. Not sure if she wants to "give in" and take medication. Has xanax prn sx and doesn't take it.   Patient Active Problem List   Diagnosis Date Noted   Family history of cancer of extrahepatic bile ducts 09/10/2021   Acute cholecystitis 02/17/2020   Right-sided low back pain without sciatica 12/21/2016   Family history of ovarian cancer 10/15/2016   History of tubal ligation 09/16/2016   History of cesarean delivery 05/15/2016   Ganglion 08/16/2009    Past Surgical History:  Procedure Laterality Date   CESAREAN SECTION  2007 & 2010   x 2   CESAREAN SECTION     CESAREAN SECTION WITH BILATERAL TUBAL LIGATION N/A 09/03/2016   Procedure: CESAREAN SECTION WITH BILATERAL TUBAL LIGATION;   Surgeon: Gae Dry, MD;  Location: [facility] ORS;  Service: Obstetrics;  Laterality: N/A;   DG CHOLECYSTOGRAPHY GALL BLADDER ([facility] HX)     WISDOM TOOTH EXTRACTION  1997   x 4    Family History  Problem Relation Age of Onset   Cancer Mother 51       bile duct   Diabetes Mother    Depression Mother    Urolithiasis Mother    Prostate cancer Father        x3   Urolithiasis Brother    Uterine cancer Maternal Aunt 64   Ovarian cancer Paternal Aunt 80   Brain cancer Paternal Uncle    Diabetes Maternal Grandfather     Social History   Socioeconomic History   Marital status: Married    Spouse name: Not on file   Number of children: Not on file   Years of education: Not on file   Highest education level: Not on file  Occupational History   Not on file  Tobacco Use   Smoking status: Former    Types: Cigarettes    Quit date: 09/03/2003    Years since quitting: 18.0   Smokeless tobacco: Never  Vaping Use   Vaping Use: Never used  Substance and Sexual Activity   Alcohol use: Yes    Alcohol/week: 0.0 standard drinks of alcohol    Comment: occ   Drug use: No   Sexual activity:  Yes    Birth control/protection: Surgical    Comment: Ablation  Other Topics Concern   Not on file  Social History Narrative   Not on file   Social Determinants of Health   Financial Resource Strain: Not on file  Food Insecurity: Not on file  Transportation Needs: Not on file  Physical Activity: Not on file  Stress: Not on file  Social Connections: Not on file  Intimate Partner Violence: Not on file     Current Outpatient Medications:    ALPRAZolam (XANAX) 0.5 MG tablet, Take 1 tablet (0.5 mg total) by mouth 2 (two) times daily as needed for anxiety., Disp: 30 tablet, Rfl: 0   Probiotic Product (PROBIOTIC PO), Take by mouth., Disp: , Rfl:    Pyridoxine HCl (VITAMIN B6) 100 MG TABS, , Disp: , Rfl:    VITAMIN E PO, Take by mouth., Disp: , Rfl:      ROS:  Review of Systems   Constitutional:  Negative for fatigue, fever and unexpected weight change.  Respiratory:  Negative for cough, shortness of breath and wheezing.   Cardiovascular:  Negative for chest pain, palpitations and leg swelling.  Gastrointestinal:  Negative for blood in stool, constipation, diarrhea, nausea and vomiting.  Endocrine: Negative for cold intolerance, heat intolerance and polyuria.  Genitourinary:  Negative for dyspareunia, dysuria, flank pain, frequency, genital sores, hematuria, menstrual problem, pelvic pain, urgency, vaginal bleeding, vaginal discharge and vaginal pain.  Musculoskeletal:  Negative for back pain, joint swelling and myalgias.  Skin:  Negative for rash.  Neurological:  Negative for dizziness, syncope, light-headedness, numbness and headaches.  Hematological:  Negative for adenopathy.  Psychiatric/Behavioral:  Negative for agitation, confusion, sleep disturbance and suicidal ideas. The patient is not nervous/anxious.   BREAST: No symptoms   Objective: BP 120/70   Ht '5\' 5"'  (1.651 m)   Wt 227 lb (103 kg)   LMP 09/08/2021 (Exact Date)   BMI 37.77 kg/m    Physical Exam Constitutional:      Appearance: She is well-developed.  Genitourinary:     Vulva normal.     Right Labia: No rash, tenderness or lesions.    Left Labia: No tenderness, lesions or rash.    No vaginal discharge, erythema or tenderness.      Right Adnexa: not tender and no mass present.    Left Adnexa: not tender and no mass present.    No cervical friability or polyp.     Uterus is not enlarged or tender.  Breasts:    Right: No mass, nipple discharge, skin change or tenderness.     Left: No mass, nipple discharge, skin change or tenderness.  Neck:     Thyroid: No thyromegaly.  Cardiovascular:     Rate and Rhythm: Normal rate and regular rhythm.     Heart sounds: Normal heart sounds. No murmur heard. Pulmonary:     Effort: Pulmonary effort is normal.     Breath sounds: Normal breath sounds.   Abdominal:     Palpations: Abdomen is soft.     Tenderness: There is no abdominal tenderness. There is no guarding or rebound.  Musculoskeletal:        General: Normal range of motion.     Cervical back: Normal range of motion.  Lymphadenopathy:     Cervical: No cervical adenopathy.  Neurological:     General: No focal deficit present.     Mental Status: She is alert and oriented to person, place, and time.  Cranial Nerves: No cranial nerve deficit.  Skin:    General: Skin is warm and dry.  Psychiatric:        Mood and Affect: Mood normal.        Behavior: Behavior normal.        Thought Content: Thought content normal.        Judgment: Judgment normal.  Vitals reviewed.     Assessment/Plan: Encounter for annual routine gynecological examination  Encounter for screening mammogram for malignant neoplasm of breast - Plan: MM 3D SCREEN BREAST BILATERAL; pt to schedule mammo  Family history of cancer of extrahepatic bile ducts - Plan: [facility] ([place]); [facility] testing discussed and done today. Will f/u with results.   Family history of ovarian cancer - Plan: [facility] ([place])  Blood tests for routine general physical examination - Plan: Comprehensive metabolic panel, Hemoglobin A1c  Screening for diabetes mellitus - Plan: Hemoglobin A1c  BMI 37.0-37.9, adult - Plan: Hemoglobin A1c  Will do fasting lipids another time.            GYN counsel breast self exam, mammography screening, adequate intake of calcium and vitamin D, diet and exercise     F/U  Return in about 1 year (around 09/11/2022).  Ayen Viviano B. Issachar Broady, PA-C 09/10/2021 5:01 PM

## 2021-09-11 LAB — COMPREHENSIVE METABOLIC PANEL
ALT: 28 IU/L (ref 0–32)
AST: 20 IU/L (ref 0–40)
Albumin/Globulin Ratio: 1.7 (ref 1.2–2.2)
Albumin: 4.3 g/dL (ref 3.8–4.8)
Alkaline Phosphatase: 103 IU/L (ref 44–121)
BUN/Creatinine Ratio: 15 (ref 9–23)
BUN: 9 mg/dL (ref 6–24)
Bilirubin Total: 0.7 mg/dL (ref 0.0–1.2)
CO2: 22 mmol/L (ref 20–29)
Calcium: 9.2 mg/dL (ref 8.7–10.2)
Chloride: 104 mmol/L (ref 96–106)
Creatinine, Ser: 0.6 mg/dL (ref 0.57–1.00)
Globulin, Total: 2.6 g/dL (ref 1.5–4.5)
Glucose: 87 mg/dL (ref 70–99)
Potassium: 4.2 mmol/L (ref 3.5–5.2)
Sodium: 139 mmol/L (ref 134–144)
Total Protein: 6.9 g/dL (ref 6.0–8.5)
eGFR: 113 mL/min/{1.73_m2} (ref >59)

## 2021-09-11 LAB — HEMOGLOBIN A1C
Est. average glucose Bld gHb Est-mCnc: 103 mg/dL
Hgb A1c MFr Bld: 5.2 % (ref 4.8–5.6)

## 2021-10-07 ENCOUNTER — Encounter: Payer: Self-pay | Admitting: Obstetrics and Gynecology

## 2021-10-08 ENCOUNTER — Ambulatory Visit
Admission: RE | Admit: 2021-10-08 | Discharge: 2021-10-08 | Disposition: A | Discharge status: Home / Self Care | Payer: Medicaid Other | Source: Ambulatory Visit | Attending: Obstetrics and Gynecology | Admitting: Obstetrics and Gynecology

## 2021-10-08 DIAGNOSIS — Z1231 Encounter for screening mammogram for malignant neoplasm of breast: Secondary | ICD-10-CM | POA: Diagnosis not present

## 2021-10-14 ENCOUNTER — Telehealth: Payer: Self-pay | Admitting: Obstetrics and Gynecology

## 2021-10-14 NOTE — Telephone Encounter (Signed)
Pt returned [facility]'s call.  Was wanting to discuss test results.

## 2021-10-16 NOTE — Telephone Encounter (Signed)
Pt aware of neg MyRisk results. IBIS=10.6%/riskscore=15.4%. No further screening recommendations at this time.   Patient understands these results only apply to her and her children, and this is not indicative of genetic testing results of her other family members. It is recommended that her other family members have genetic testing done.  Pt also understands negative genetic testing doesn't mean she will never get any of these cancers.   Hard copy mailed to pt. F/u prn.

## 2021-11-13 ENCOUNTER — Encounter: Payer: Self-pay | Admitting: Obstetrics and Gynecology

## 2022-03-15 IMAGING — MG MM DIGITAL SCREENING BILAT W/ TOMO AND CAD
8 series · 8 of 24 positions shown · non-contrast
Comparison: Previous exam(s).

CLINICAL DATA: Screening.

EXAM:
DIGITAL SCREENING BILATERAL MAMMOGRAM WITH TOMOSYNTHESIS AND CAD
TECHNIQUE: Bilateral screening digital craniocaudal and mediolateral oblique
mammograms were obtained. Bilateral screening digital breast
tomosynthesis was performed. The images were evaluated with
computer-aided detection.

[L MLO synth-2D]
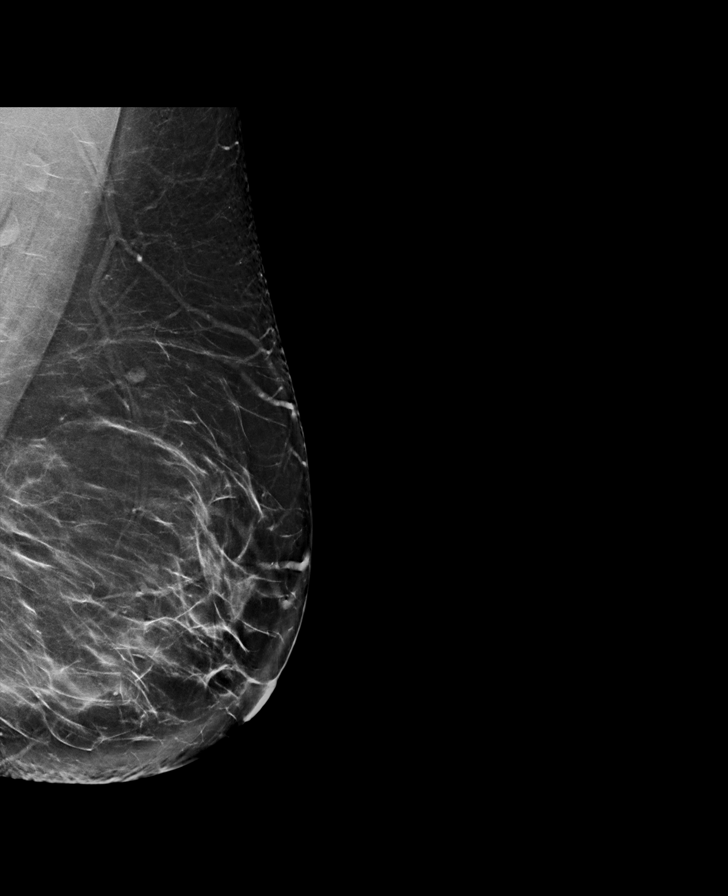

[R MLO synth-2D]
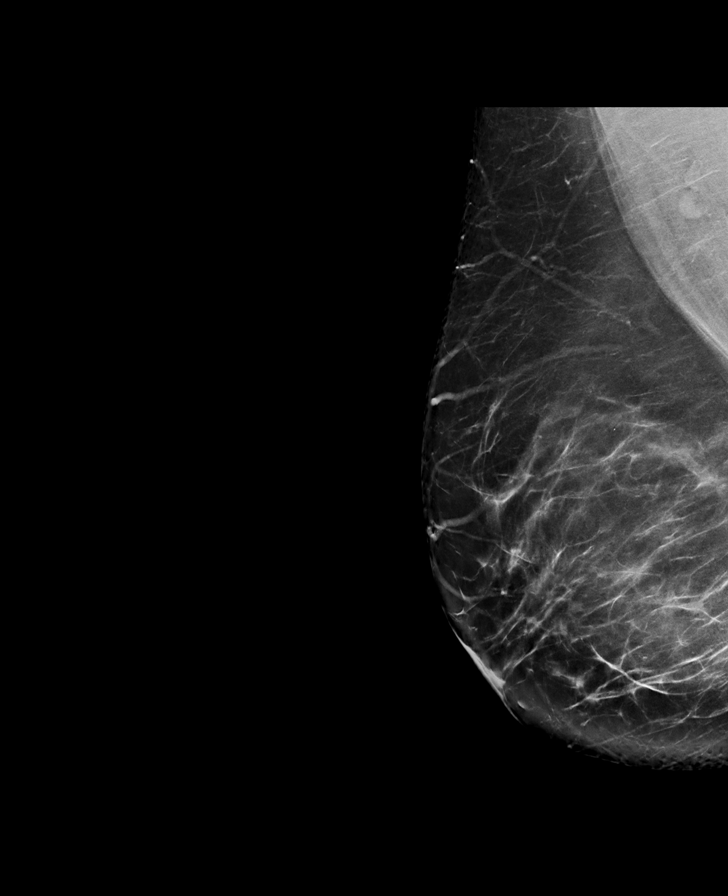

[L CC synth-2D]
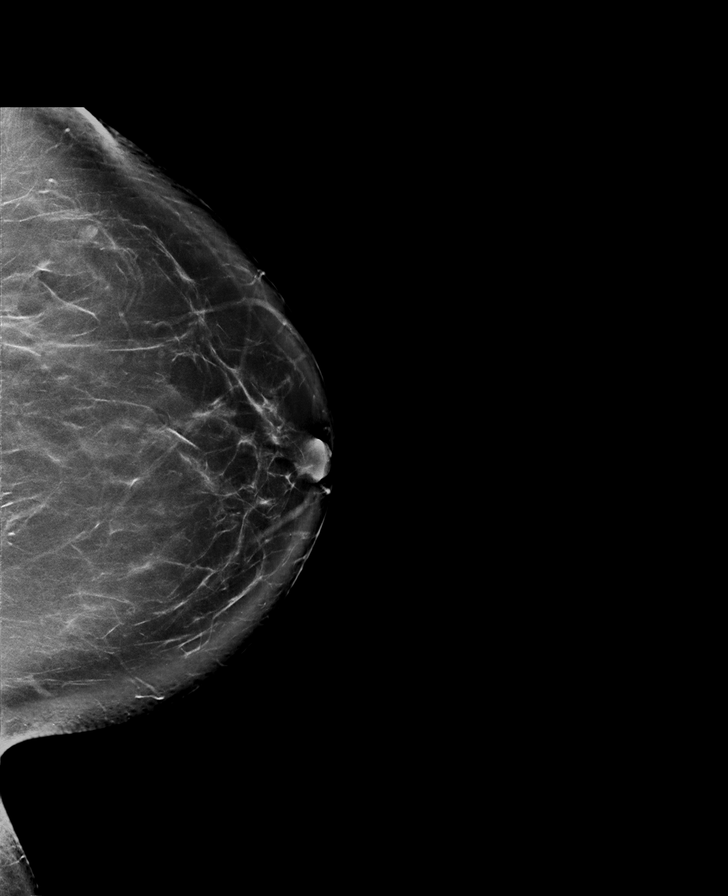

[R CC synth-2D]
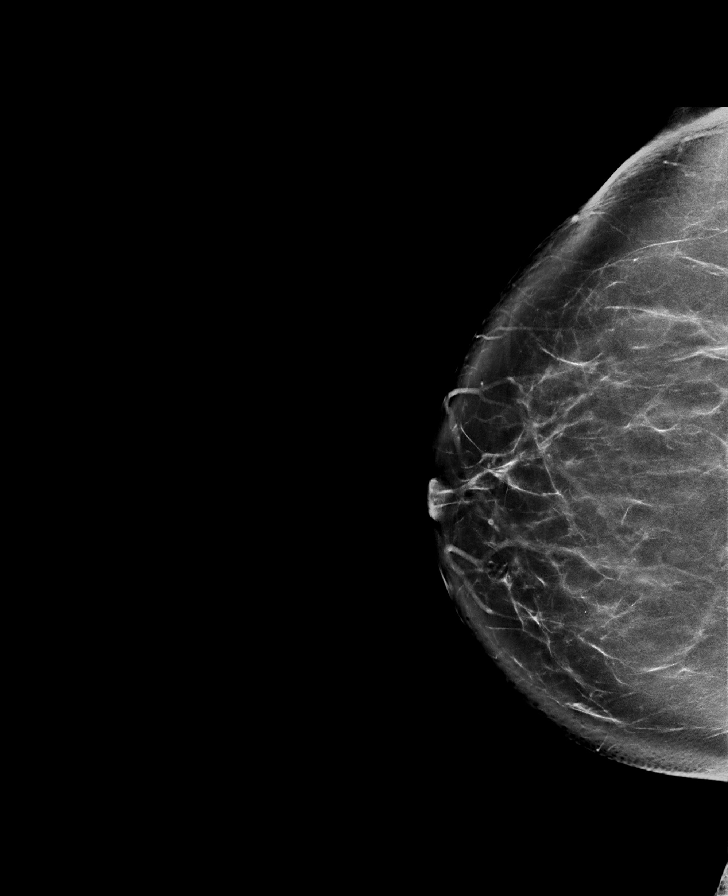

[R CC tomo · tomo slice 49/97.0]
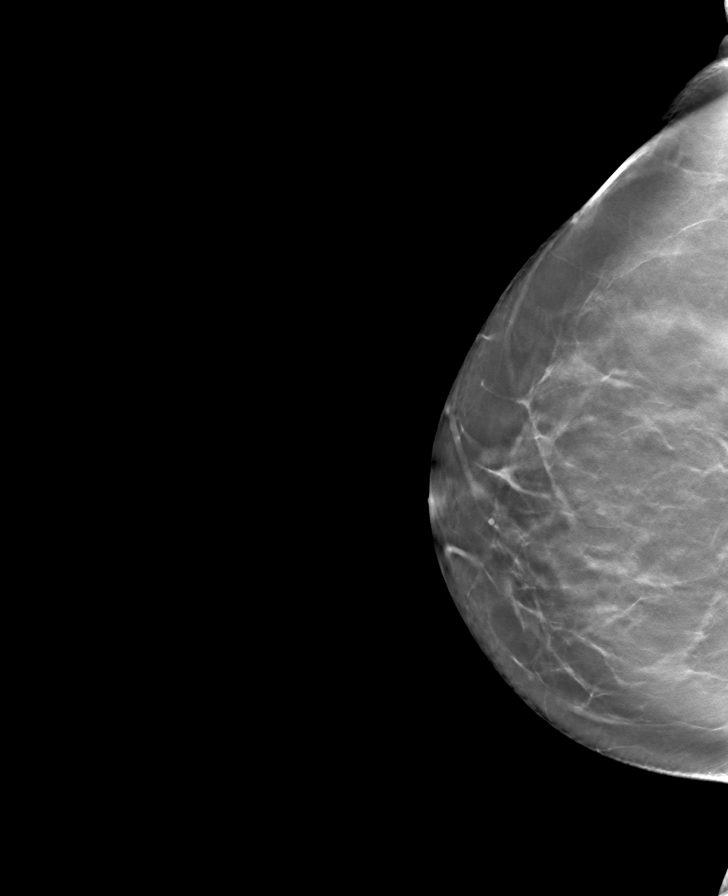

[R MLO tomo · tomo slice 51/101.0]
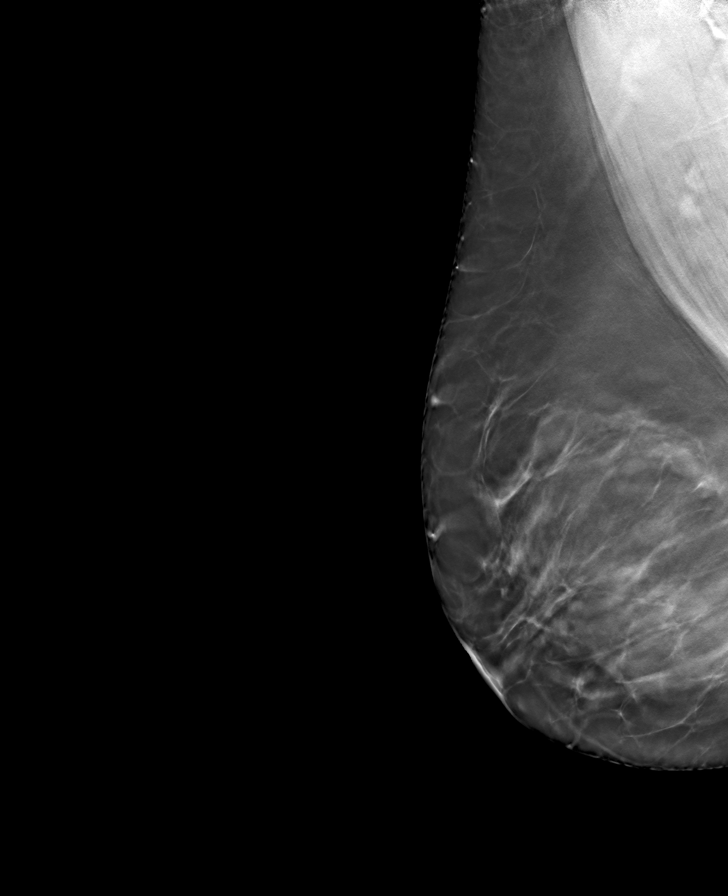

[L MLO tomo · tomo slice 48/95.0]
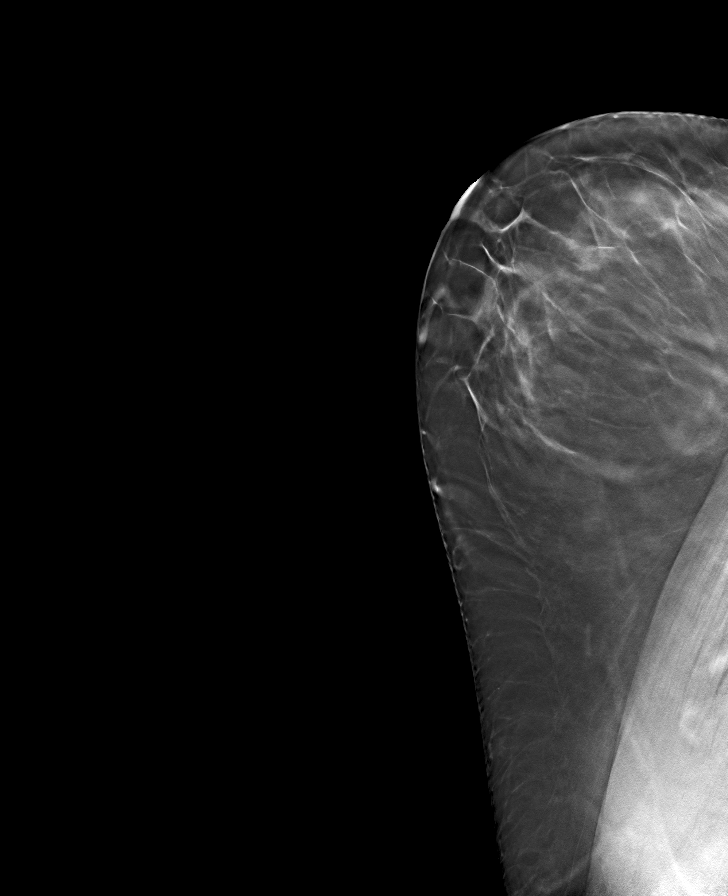

[L CC tomo · tomo slice 51/102.0]
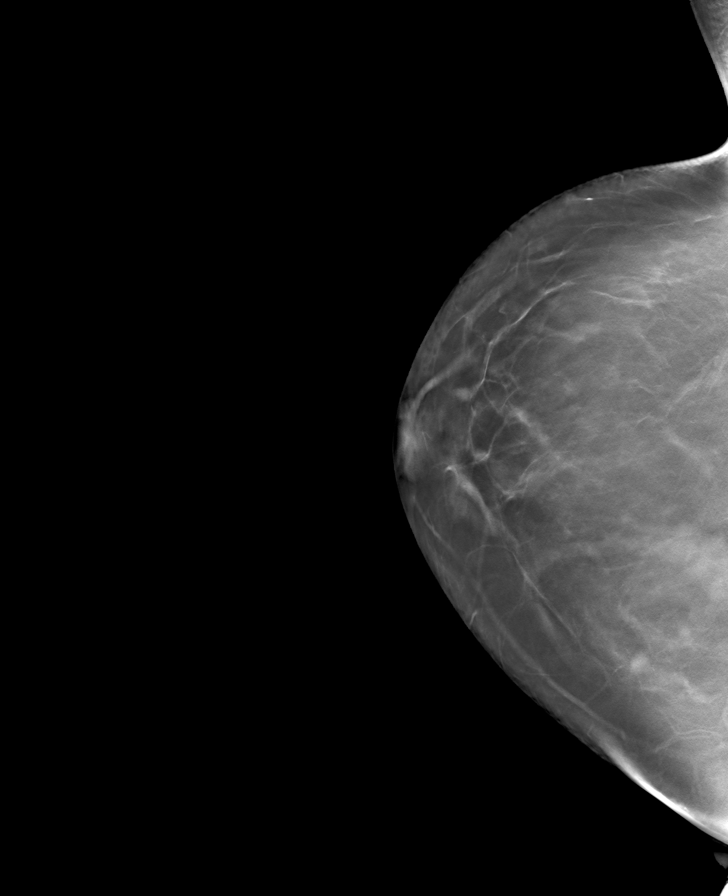

[8 of 24 positions shown; findings below may reference images not displayed]

ACR Breast Density Category b: There are scattered areas of
fibroglandular density.
FINDINGS: There are no findings suspicious for malignancy. The images were
evaluated with computer-aided detection.
IMPRESSION: No mammographic evidence of malignancy. A result letter of this
screening mammogram will be mailed directly to the patient.

RECOMMENDATION:
Screening mammogram in one year. (Code:WJ-I-BG6)

BI-RADS CATEGORY  1: Negative.

## 2022-09-17 ENCOUNTER — Other Ambulatory Visit: Payer: Self-pay | Admitting: Obstetrics and Gynecology

## 2022-09-17 DIAGNOSIS — Z1231 Encounter for screening mammogram for malignant neoplasm of breast: Secondary | ICD-10-CM

## 2022-10-12 ENCOUNTER — Ambulatory Visit
Admission: RE | Admit: 2022-10-12 | Discharge: 2022-10-12 | Disposition: A | Discharge status: Home / Self Care | Payer: Medicaid Other | Source: Ambulatory Visit | Attending: Obstetrics and Gynecology | Admitting: Obstetrics and Gynecology

## 2022-10-12 DIAGNOSIS — Z1231 Encounter for screening mammogram for malignant neoplasm of breast: Secondary | ICD-10-CM | POA: Insufficient documentation

## 2022-11-06 ENCOUNTER — Ambulatory Visit: Payer: Medicaid Other | Admitting: Obstetrics and Gynecology

## 2022-11-10 NOTE — Progress Notes (Unsigned)
PCP:  Pcp, No   No chief complaint on file.    HPI:      [name] is a 47 y.o. [phone] whose LMP was No LMP recorded. Patient has had an ablation., presents today for her annual examination.  Her menses are regular every 28-30 days, lasting 3 days, light flow.  Dysmenorrhea mild,  She does not have intermenstrual bleeding. S/p endometrial ablation and menses much improved.  Sex activity: single partner, contraception - tubal ligation. No pain/bleeding. Last Pap: 07/08/20 Results were: no abnormalities /neg HPV DNA   Last mammogram: 10/12/22  Results were: normal--routine follow-up in 12 months There is no FH of breast cancer. There is a FH of bile duct cancer in her mom, ovarian cancer in her mat aunt and pat aunt. Pt is MyRisk neg 7/23. IBIS=10.6%/riskscore=15.4%. She does SBE.   Tobacco use: The patient denies current or previous tobacco use. Alcohol use: social drinker No drug use.  Exercise: not active  She does get adequate calcium but not Vitamin D in her diet. Dr. Tiburcio Pea did labs; lipid panel due but not fasting today. Had elevated non-fasting glucose labs in past.   Has some anxiety issues due to fam stressors. Not sure if she wants to "give in" and take medication. Has xanax prn sx and doesn't take it.   Patient Active Problem List   Diagnosis Date Noted   Family history of cancer of extrahepatic bile ducts 09/10/2021   Acute cholecystitis 02/17/2020   Right-sided low back pain without sciatica 12/21/2016   Family history of ovarian cancer 10/15/2016   History of tubal ligation 09/16/2016   History of cesarean delivery 05/15/2016   Ganglion 08/16/2009    Past Surgical History:  Procedure Laterality Date   CESAREAN SECTION  2007 & 2010   x 2   CESAREAN SECTION     CESAREAN SECTION WITH BILATERAL TUBAL LIGATION N/A 09/03/2016   Procedure: CESAREAN SECTION WITH BILATERAL TUBAL LIGATION;  Surgeon: Nadara Mustard, MD;  Location: [facility] ORS;  Service:  Obstetrics;  Laterality: N/A;   DG CHOLECYSTOGRAPHY GALL BLADDER ([facility] HX)     WISDOM TOOTH EXTRACTION  1997   x 4    Family History  Problem Relation Age of Onset   Cancer Mother 34       bile duct   Diabetes Mother    Depression Mother    Urolithiasis Mother    Prostate cancer Father        x3   Uterine cancer Maternal Aunt 14   Ovarian cancer Paternal Aunt 109   Brain cancer Paternal Uncle    Diabetes Maternal Grandfather    Urolithiasis Brother    Breast cancer Neg Hx     Social History   Socioeconomic History   Marital status: Married    Spouse name: Not on file   Number of children: Not on file   Years of education: Not on file   Highest education level: Not on file  Occupational History   Not on file  Tobacco Use   Smoking status: Former    Current packs/day: 0.00    Types: Cigarettes    Quit date: 09/03/2003    Years since quitting: 19.2   Smokeless tobacco: Never  Vaping Use   Vaping status: Never Used  Substance and Sexual Activity   Alcohol use: Yes    Alcohol/week: 0.0 standard drinks of alcohol    Comment: occ   Drug use: No   Sexual  activity: Yes    Birth control/protection: Surgical    Comment: Ablation  Other Topics Concern   Not on file  Social History Narrative   Not on file   Social Determinants of Health   Financial Resource Strain: Not on file  Food Insecurity: Not on file  Transportation Needs: Not on file  Physical Activity: Not on file  Stress: Not on file  Social Connections: Not on file  Intimate Partner Violence: Not on file     Current Outpatient Medications:    ALPRAZolam (XANAX) 0.5 MG tablet, Take 1 tablet (0.5 mg total) by mouth 2 (two) times daily as needed for anxiety., Disp: 30 tablet, Rfl: 0   Probiotic Product (PROBIOTIC PO), Take by mouth., Disp: , Rfl:    Pyridoxine HCl (VITAMIN B6) 100 MG TABS, , Disp: , Rfl:    VITAMIN E PO, Take by mouth., Disp: , Rfl:      ROS:  Review of Systems  Constitutional:   Negative for fatigue, fever and unexpected weight change.  Respiratory:  Negative for cough, shortness of breath and wheezing.   Cardiovascular:  Negative for chest pain, palpitations and leg swelling.  Gastrointestinal:  Negative for blood in stool, constipation, diarrhea, nausea and vomiting.  Endocrine: Negative for cold intolerance, heat intolerance and polyuria.  Genitourinary:  Negative for dyspareunia, dysuria, flank pain, frequency, genital sores, hematuria, menstrual problem, pelvic pain, urgency, vaginal bleeding, vaginal discharge and vaginal pain.  Musculoskeletal:  Negative for back pain, joint swelling and myalgias.  Skin:  Negative for rash.  Neurological:  Negative for dizziness, syncope, light-headedness, numbness and headaches.  Hematological:  Negative for adenopathy.  Psychiatric/Behavioral:  Negative for agitation, confusion, sleep disturbance and suicidal ideas. The patient is not nervous/anxious.   BREAST: No symptoms   Objective: There were no vitals taken for this visit.   Physical Exam Constitutional:      Appearance: She is well-developed.  Genitourinary:     Vulva normal.     Right Labia: No rash, tenderness or lesions.    Left Labia: No tenderness, lesions or rash.    No vaginal discharge, erythema or tenderness.      Right Adnexa: not tender and no mass present.    Left Adnexa: not tender and no mass present.    No cervical friability or polyp.     Uterus is not enlarged or tender.  Breasts:    Right: No mass, nipple discharge, skin change or tenderness.     Left: No mass, nipple discharge, skin change or tenderness.  Neck:     Thyroid: No thyromegaly.  Cardiovascular:     Rate and Rhythm: Normal rate and regular rhythm.     Heart sounds: Normal heart sounds. No murmur heard. Pulmonary:     Effort: Pulmonary effort is normal.     Breath sounds: Normal breath sounds.  Abdominal:     Palpations: Abdomen is soft.     Tenderness: There is no  abdominal tenderness. There is no guarding or rebound.  Musculoskeletal:        General: Normal range of motion.     Cervical back: Normal range of motion.  Lymphadenopathy:     Cervical: No cervical adenopathy.  Neurological:     General: No focal deficit present.     Mental Status: She is alert and oriented to person, place, and time.     Cranial Nerves: No cranial nerve deficit.  Skin:    General: Skin is warm and dry.  Psychiatric:        Mood and Affect: Mood normal.        Behavior: Behavior normal.        Thought Content: Thought content normal.        Judgment: Judgment normal.  Vitals reviewed.     Assessment/Plan: Encounter for annual routine gynecological examination  Encounter for screening mammogram for malignant neoplasm of breast - Plan: MM 3D SCREEN BREAST BILATERAL; pt to schedule mammo  Family history of cancer of extrahepatic bile ducts - Plan: Integrated BRACAnalysis ([facility]); [facility] testing discussed and done today. Will f/u with results.   Family history of ovarian cancer - Plan: Integrated BRACAnalysis [facility])  Blood tests for routine general physical examination - Plan: Comprehensive metabolic panel, Hemoglobin A1c  Screening for diabetes mellitus - Plan: Hemoglobin A1c  BMI 37.0-37.9, adult - Plan: Hemoglobin A1c  Will do fasting lipids another time.            GYN counsel breast self exam, mammography screening, adequate intake of calcium and vitamin D, diet and exercise     F/U  No follow-ups on file.  Becki Mccaskill B. Shirlyn Savin, PA-C 11/10/2022 4:31 PM

## 2022-11-12 ENCOUNTER — Ambulatory Visit (INDEPENDENT_AMBULATORY_CARE_PROVIDER_SITE_OTHER): Payer: Medicaid Other | Admitting: Obstetrics and Gynecology

## 2022-11-12 ENCOUNTER — Encounter: Payer: Self-pay | Admitting: Obstetrics and Gynecology

## 2022-11-12 VITALS — BP 114/70 | Ht 65.0 in | Wt 240.0 lb

## 2022-11-12 DIAGNOSIS — Z1231 Encounter for screening mammogram for malignant neoplasm of breast: Secondary | ICD-10-CM

## 2022-11-12 DIAGNOSIS — Z01419 Encounter for gynecological examination (general) (routine) without abnormal findings: Secondary | ICD-10-CM | POA: Diagnosis not present

## 2022-11-12 DIAGNOSIS — R195 Other fecal abnormalities: Secondary | ICD-10-CM

## 2022-11-12 DIAGNOSIS — Z1329 Encounter for screening for other suspected endocrine disorder: Secondary | ICD-10-CM

## 2022-11-12 DIAGNOSIS — Z1211 Encounter for screening for malignant neoplasm of colon: Secondary | ICD-10-CM

## 2022-11-12 DIAGNOSIS — Z0000 Encounter for general adult medical examination without abnormal findings: Secondary | ICD-10-CM

## 2022-11-12 DIAGNOSIS — Z6839 Body mass index (BMI) 39.0-39.9, adult: Secondary | ICD-10-CM

## 2022-11-12 DIAGNOSIS — Z131 Encounter for screening for diabetes mellitus: Secondary | ICD-10-CM

## 2022-11-12 DIAGNOSIS — Z13220 Encounter for screening for lipoid disorders: Secondary | ICD-10-CM

## 2022-11-12 NOTE — Patient Instructions (Signed)
I value your feedback and you entrusting us with your care. If you get a [facility] patient survey, I would appreciate you taking the time to let us know about your experience today. Thank you! ? ? ?

## 2022-11-13 LAB — COMPREHENSIVE METABOLIC PANEL
ALT: 28 IU/L (ref 0–32)
AST: 21 IU/L (ref 0–40)
Albumin: 4.3 g/dL (ref 3.9–4.9)
Alkaline Phosphatase: 120 IU/L (ref 44–121)
BUN/Creatinine Ratio: 13 (ref 9–23)
BUN: 11 mg/dL (ref 6–24)
Bilirubin Total: 1.1 mg/dL (ref 0.0–1.2)
CO2: 21 mmol/L (ref 20–29)
Calcium: 9.3 mg/dL (ref 8.7–10.2)
Chloride: 101 mmol/L (ref 96–106)
Creatinine, Ser: 0.82 mg/dL (ref 0.57–1.00)
Globulin, Total: 2.5 g/dL (ref 1.5–4.5)
Glucose: 94 mg/dL (ref 70–99)
Potassium: 4.3 mmol/L (ref 3.5–5.2)
Sodium: 137 mmol/L (ref 134–144)
Total Protein: 6.8 g/dL (ref 6.0–8.5)
eGFR: 90 mL/min/{1.73_m2} (ref >59)

## 2022-11-13 LAB — LIPID PANEL
Chol/HDL Ratio: 3.3 ratio (ref 0.0–4.4)
Cholesterol, Total: 180 mg/dL (ref 100–199)
HDL: 54 mg/dL (ref >39)
LDL Chol Calc (NIH): 109 mg/dL — ABNORMAL HIGH (ref 0–99)
Triglycerides: 93 mg/dL (ref 0–149)
VLDL Cholesterol Cal: 17 mg/dL (ref 5–40)

## 2022-11-13 LAB — TSH+FREE T4
Free T4: 0.98 ng/dL (ref 0.82–1.77)
TSH: 1.41 u[IU]/mL (ref 0.450–4.500)

## 2022-11-13 LAB — HEMOGLOBIN A1C
Est. average glucose Bld gHb Est-mCnc: 108 mg/dL
Hgb A1c MFr Bld: 5.4 % (ref 4.8–5.6)

## 2022-11-27 ENCOUNTER — Telehealth: Payer: Medicaid Other | Admitting: Physician Assistant

## 2022-11-27 DIAGNOSIS — R3989 Other symptoms and signs involving the genitourinary system: Secondary | ICD-10-CM | POA: Diagnosis not present

## 2022-11-27 MED ORDER — NITROFURANTOIN MONOHYD MACRO 100 MG PO CAPS: 100.0000 mg | ORAL_CAPSULE | Freq: Two times a day (BID) | ORAL | 0 refills | Status: DC

## 2022-11-27 NOTE — Progress Notes (Signed)
E-Visit for Urinary Problems  We are sorry that you are not feeling well.  Here is how we plan to help!  Based on what you shared with me it looks like you most likely have a simple urinary tract infection.  A UTI (Urinary Tract Infection) is a bacterial infection of the bladder.  Most cases of urinary tract infections are simple to treat but a key part of your care is to encourage you to drink plenty of fluids and watch your symptoms carefully.  I have prescribed MacroBid 100 mg twice a day for 5 days.  Your symptoms should gradually improve. Call us if the burning in your urine worsens, you develop worsening fever, back pain or pelvic pain or if your symptoms do not resolve after completing the antibiotic.  Urinary tract infections can be prevented by drinking plenty of water to keep your body hydrated.  Also be sure when you wipe, wipe from front to back and don't hold it in!  If possible, empty your bladder every 4 hours.  HOME CARE Drink plenty of fluids Compete the full course of the antibiotics even if the symptoms resolve Remember, when you need to go.go. Holding in your urine can increase the likelihood of getting a UTI! GET HELP RIGHT AWAY IF: You cannot urinate You get a high fever Worsening back pain occurs You see blood in your urine You feel sick to your stomach or throw up You feel like you are going to pass out  MAKE SURE YOU  Understand these instructions. Will watch your condition. Will get help right away if you are not doing well or get worse.   Thank you for choosing an e-visit.  Your e-visit answers were reviewed by a board certified advanced clinical practitioner to complete your personal care plan. Depending upon the condition, your plan could have included both over the counter or prescription medications.  Please review your pharmacy choice. Make sure the pharmacy is open so you can pick up prescription now. If there is a problem, you may contact your  provider through MyChart messaging and have the prescription routed to another pharmacy.  Your safety is important to us. If you have drug allergies check your prescription carefully.   For the next 24 hours you can use MyChart to ask questions about today's visit, request a non-urgent call back, or ask for a work or school excuse. You will get an email in the next two days asking about your experience. I hope that your e-visit has been valuable and will speed your recovery.  I have spent 5 minutes in review of e-visit questionnaire, review and updating patient chart, medical decision making and response to patient.   Emily Snider M Heberto Sturdevant, PA-C  

## 2022-11-29 LAB — COLOGUARD: COLOGUARD: POSITIVE — AB

## 2022-11-30 ENCOUNTER — Telehealth: Payer: Self-pay

## 2022-11-30 NOTE — Addendum Note (Signed)
Addended by: Althea Grimmer B on: 11/30/2022 02:23 PM   Modules accepted: Orders

## 2022-11-30 NOTE — Telephone Encounter (Signed)
Pt requesting call back to schedule colonosocopy

## 2022-12-01 ENCOUNTER — Telehealth: Payer: Self-pay

## 2022-12-01 ENCOUNTER — Telehealth: Payer: Self-pay | Admitting: *Deleted

## 2022-12-01 ENCOUNTER — Other Ambulatory Visit: Payer: Self-pay | Admitting: *Deleted

## 2022-12-01 DIAGNOSIS — Z1211 Encounter for screening for malignant neoplasm of colon: Secondary | ICD-10-CM

## 2022-12-01 MED ORDER — NA SULFATE-K SULFATE-MG SULF 17.5-3.13-1.6 GM/177ML PO SOLN: 1.0000 | Freq: Once | ORAL | 0 refills | Status: AC

## 2022-12-01 NOTE — Telephone Encounter (Signed)
Gastroenterology Pre-Procedure Review  Request Date: 12/08/2022 Requesting Physician: Dr. Servando Snare  PATIENT REVIEW QUESTIONS: The patient responded to the following health history questions as indicated:    1. Are you having any GI issues? no 2. Do you have a personal history of Polyps? no 3. Do you have a family history of Colon Cancer or Polyps? no 4. Diabetes Mellitus? no 5. Joint replacements in the past 12 months?no 6. Major health problems in the past 3 months?no 7. Any artificial heart valves, MVP, or defibrillator?no    MEDICATIONS & ALLERGIES:    Patient reports the following regarding taking any anticoagulation/antiplatelet therapy:   Plavix, Coumadin, Eliquis, Xarelto, Lovenox, Pradaxa, Brilinta, or Effient? no Aspirin? no  Patient confirms/reports the following medications:  Current Outpatient Medications  Medication Sig Dispense Refill   Na Sulfate-K Sulfate-Mg Sulf 17.5-3.13-1.6 GM/177ML SOLN Take 1 kit by mouth once for 1 dose. 354 mL 0   famotidine (PEPCID) 20 MG tablet Take 20 mg by mouth as needed for heartburn or indigestion.     nitrofurantoin, macrocrystal-monohydrate, (MACROBID) 100 MG capsule Take 1 capsule (100 mg total) by mouth 2 (two) times daily. 10 capsule 0   No current facility-administered medications for this visit.    Patient confirms/reports the following allergies:  No Known Allergies  No orders of the defined types were placed in this encounter.   AUTHORIZATION INFORMATION Primary Insurance: 1D#: Group #:  Secondary Insurance: 1D#: Group #:  SCHEDULE INFORMATION: Date: 12/08/2022 Time: Location:  [facility]

## 2022-12-01 NOTE — Telephone Encounter (Signed)
Pt requesting call back to schedule colonoscopy.

## 2022-12-01 NOTE — Telephone Encounter (Signed)
Colonoscopy schedule on 12/08/2022 with Dr Servando Snare

## 2022-12-08 ENCOUNTER — Ambulatory Visit: Payer: Medicaid Other | Admitting: Anesthesiology

## 2022-12-08 ENCOUNTER — Ambulatory Visit
Admission: RE | Admit: 2022-12-08 | Discharge: 2022-12-08 | Disposition: A | Discharge status: Home / Self Care | Payer: Medicaid Other | Attending: Gastroenterology | Admitting: Gastroenterology

## 2022-12-08 ENCOUNTER — Other Ambulatory Visit: Payer: Self-pay

## 2022-12-08 ENCOUNTER — Encounter
Admission: RE | Disposition: A | Discharge status: Home / Self Care | Payer: Self-pay | Source: Home / Self Care | Attending: Gastroenterology

## 2022-12-08 DIAGNOSIS — Z1211 Encounter for screening for malignant neoplasm of colon: Secondary | ICD-10-CM | POA: Diagnosis present

## 2022-12-08 DIAGNOSIS — D125 Benign neoplasm of sigmoid colon: Secondary | ICD-10-CM | POA: Insufficient documentation

## 2022-12-08 DIAGNOSIS — K635 Polyp of colon: Secondary | ICD-10-CM

## 2022-12-08 DIAGNOSIS — Z87891 Personal history of nicotine dependence: Secondary | ICD-10-CM | POA: Insufficient documentation

## 2022-12-08 DIAGNOSIS — R195 Other fecal abnormalities: Secondary | ICD-10-CM | POA: Diagnosis not present

## 2022-12-08 HISTORY — PX: POLYPECTOMY: SHX5525

## 2022-12-08 HISTORY — PX: COLONOSCOPY WITH PROPOFOL: SHX5780

## 2022-12-08 SURGERY — COLONOSCOPY WITH PROPOFOL
Anesthesia: General

## 2022-12-08 MED ORDER — STERILE WATER FOR IRRIGATION IR SOLN
Status: DC | PRN
  Administered 2022-12-08: 150 mL

## 2022-12-08 MED ORDER — PHENYLEPHRINE 80 MCG/ML (10ML) SYRINGE FOR IV PUSH (FOR BLOOD PRESSURE SUPPORT)
PREFILLED_SYRINGE | INTRAVENOUS | Status: AC
  Filled 2022-12-08: qty 10

## 2022-12-08 MED ORDER — LIDOCAINE HCL (PF) 2 % IJ SOLN
INTRAMUSCULAR | Status: AC
  Filled 2022-12-08: qty 5

## 2022-12-08 MED ORDER — SODIUM CHLORIDE 0.9 % IV SOLN: INTRAVENOUS | Status: DC

## 2022-12-08 MED ORDER — PROPOFOL 1000 MG/100ML IV EMUL
INTRAVENOUS | Status: AC
  Filled 2022-12-08: qty 200

## 2022-12-08 MED ORDER — LIDOCAINE HCL (CARDIAC) PF 100 MG/5ML IV SOSY
PREFILLED_SYRINGE | INTRAVENOUS | Status: DC | PRN
  Administered 2022-12-08: 50 mg via INTRAVENOUS

## 2022-12-08 MED ORDER — PROPOFOL 10 MG/ML IV BOLUS
INTRAVENOUS | Status: DC | PRN
  Administered 2022-12-08 (×2): 40 mg via INTRAVENOUS
  Administered 2022-12-08: 120 mg via INTRAVENOUS
  Administered 2022-12-08 (×5): 40 mg via INTRAVENOUS

## 2022-12-08 MED ORDER — PROPOFOL 10 MG/ML IV BOLUS
INTRAVENOUS | Status: AC
  Filled 2022-12-08: qty 40

## 2022-12-08 MED ORDER — LIDOCAINE HCL (PF) 2 % IJ SOLN
INTRAMUSCULAR | Status: AC
  Filled 2022-12-08: qty 10

## 2022-12-08 NOTE — Op Note (Signed)
[facility] Gastroenterology Patient Name: Emily Warren Procedure Date: 12/08/2022 7:04 AM MRN: [id number] Account #: [id number] Date of Birth: 1976-03-02 Admit Type: Outpatient Age: 47 Room: [facility] ENDO ROOM 4 Gender: Female Note Status: Finalized Instrument Name: Prentice Docker [id number] Procedure:             Colonoscopy Indications:           Positive Cologuard test Providers:             [name] MD, MD Medicines:             Propofol per Anesthesia Complications:         No immediate complications. Procedure:             Pre-Anesthesia Assessment:                        - Prior to the procedure, a History and Physical was                         performed, and patient medications and allergies were                         reviewed. The patient's tolerance of previous                         anesthesia was also reviewed. The risks and benefits                         of the procedure and the sedation options and risks                         were discussed with the patient. All questions were                         answered, and informed consent was obtained. Prior                         Anticoagulants: The patient has taken no anticoagulant                         or antiplatelet agents. ASA Grade Assessment: II - A                         patient with mild systemic disease. After reviewing                         the risks and benefits, the patient was deemed in                         satisfactory condition to undergo the procedure.                        After obtaining informed consent, the colonoscope was                         passed under direct vision. Throughout the procedure,                         the patient's blood pressure, pulse,  and oxygen                         saturations were monitored continuously. The                         Colonoscope was introduced through the anus and                         advanced to the the cecum, identified  by appendiceal                         orifice and ileocecal valve. The colonoscopy was                         performed without difficulty. The patient tolerated                         the procedure well. The quality of the bowel                         preparation was excellent. Findings:      The perianal and digital rectal examinations were normal.      A 9 mm polyp was found in the sigmoid colon. The polyp was sessile. The       polyp was removed with a cold snare. Resection and retrieval were       complete. Impression:            - One 9 mm polyp in the sigmoid colon, removed with a                         cold snare. Resected and retrieved. Recommendation:        - Discharge patient to home.                        - Resume previous diet.                        - Continue present medications.                        - Await pathology results.                        - If the pathology report reveals adenomatous tissue,                         then repeat the colonoscopy for surveillance in 3                         years. Procedure Code(s):     --- Professional ---                        [phone], Colonoscopy, flexible; with removal of                         tumor(s), polyp(s), or other lesion(s) by snare  technique Diagnosis Code(s):     --- Professional ---                        R19.5, Other fecal abnormalities                        D12.5, Benign neoplasm of sigmoid colon CPT copyright 2022 American Medical Association. All rights reserved. The codes documented in this report are preliminary and upon coder review may  be revised to meet current compliance requirements. [name] MD, MD 12/08/2022 8:24:08 AM This report has been signed electronically. Number of Addenda: 0 Note Initiated On: 12/08/2022 7:04 AM Scope Withdrawal Time: 0 hours 14 minutes 11 seconds  Total Procedure Duration: 0 hours 18 minutes 3 seconds  Estimated Blood Loss:  Estimated  blood loss: none.      [facility]

## 2022-12-08 NOTE — Anesthesia Preprocedure Evaluation (Addendum)
Anesthesia Evaluation  Patient identified by MRN, date of birth, ID band Patient awake    Reviewed: Allergy & Precautions, H&P , NPO status , Patient's Chart, lab work & pertinent test results  History of Anesthesia Complications (+) history of anesthetic complications (severe itching after anesthesia with 2 c-sections.)  Airway Mallampati: II  TM Distance: >3 FB Neck ROM: full    Dental no notable dental hx.    Pulmonary former smoker   Pulmonary exam normal        Cardiovascular negative cardio ROS Normal cardiovascular exam     Neuro/Psych negative neurological ROS  negative psych ROS   GI/Hepatic negative GI ROS, Neg liver ROS,,,  Endo/Other  negative endocrine ROS    Renal/GU negative Renal ROS  negative genitourinary   Musculoskeletal   Abdominal  (+) + obese  Peds  Hematology negative hematology ROS (+)   Anesthesia Other Findings Past Medical History: 09/2021: BRCA negative     Comment:  MyRisk neg; IBIS=10.6%/riskscore=15.4% No date: Complication of anesthesia     Comment:  severe itching after anesthesia with 2 c-sections. No date: Family history of cancer of extrahepatic bile ducts No date: Family history of ovarian cancer     Comment:  1/20; 5/22 cancer genetic testing letter sent No date: Gestational diabetes  Past Surgical History: 2007 & 2010: CESAREAN SECTION     Comment:  x 2 No date: CESAREAN SECTION 09/03/2016: CESAREAN SECTION WITH BILATERAL TUBAL LIGATION; N/A     Comment:  Procedure: CESAREAN SECTION WITH BILATERAL TUBAL               LIGATION;  Surgeon: Nadara Mustard, MD;  Location: [facility]              ORS;  Service: Obstetrics;  Laterality: N/A; No date: DG CHOLECYSTOGRAPHY GALL BLADDER ([facility] HX) 1997: WISDOM TOOTH EXTRACTION     Comment:  x 4     Reproductive/Obstetrics negative OB ROS                             Anesthesia Physical Anesthesia  Plan  ASA: 2  Anesthesia Plan: General   Post-op Pain Management:    Induction:   PONV Risk Score and Plan: Propofol infusion and TIVA  Airway Management Planned: Natural Airway  Additional Equipment:   Intra-op Plan:   Post-operative Plan:   Informed Consent: I have reviewed the patients History and Physical, chart, labs and discussed the procedure including the risks, benefits and alternatives for the proposed anesthesia with the patient or authorized representative who has indicated his/her understanding and acceptance.     Dental Advisory Given  Plan Discussed with: CRNA and Surgeon  Anesthesia Plan Comments: (Pt refuses pregnancy test that was offered. General risk to a fetus was discussed.)        Anesthesia Quick Evaluation

## 2022-12-08 NOTE — H&P (Signed)
[name], MD [facility] [address] [place], [place] [address] [phone] Fax : [phone]  Primary Care Physician:  Trenda Moots Primary Gastroenterologist:  Dr. Servando Snare  Pre-Procedure History & Physical: HPI:  Emily Warren is a 47 y.o. female is here for an colonoscopy.   Past Medical History:  Diagnosis Date   BRCA negative 09/2021   MyRisk neg; IBIS=10.6%/riskscore=15.4%   Complication of anesthesia    severe itching after anesthesia with 2 c-sections.   Family history of cancer of extrahepatic bile ducts    Family history of ovarian cancer    1/20; 5/22 cancer genetic testing letter sent   Gestational diabetes     Past Surgical History:  Procedure Laterality Date   CESAREAN SECTION  2007 & 2010   x 2   CESAREAN SECTION     CESAREAN SECTION WITH BILATERAL TUBAL LIGATION N/A 09/03/2016   Procedure: CESAREAN SECTION WITH BILATERAL TUBAL LIGATION;  Surgeon: Nadara Mustard, MD;  Location: [facility] ORS;  Service: Obstetrics;  Laterality: N/A;   DG CHOLECYSTOGRAPHY GALL BLADDER ([facility] HX)     WISDOM TOOTH EXTRACTION  1997   x 4    Prior to Admission medications   Medication Sig Start Date End Date Taking? Authorizing Provider  famotidine (PEPCID) 20 MG tablet Take 20 mg by mouth as needed for heartburn or indigestion.   Yes [provider]  nitrofurantoin, macrocrystal-monohydrate, (MACROBID) 100 MG capsule Take 1 capsule (100 mg total) by mouth 2 (two) times daily. Patient not taking: Reported on 12/08/2022 11/27/22   Margaretann Loveless, PA-C    Allergies as of 12/02/2022   (No Known Allergies)    Family History  Problem Relation Age of Onset   Cancer Mother 72       bile duct   Diabetes Mother    Depression Mother    Urolithiasis Mother    Prostate cancer Father        x3   Uterine cancer Maternal Aunt 65   Ovarian cancer Paternal Aunt 61   Brain cancer Paternal Uncle    Diabetes Maternal Grandfather    Urolithiasis  Brother    Breast cancer Neg Hx     Social History   Socioeconomic History   Marital status: Married    Spouse name: Not on file   Number of children: Not on file   Years of education: Not on file   Highest education level: Not on file  Occupational History   Not on file  Tobacco Use   Smoking status: Former    Current packs/day: 0.00    Types: Cigarettes    Quit date: 09/03/2003    Years since quitting: 19.2   Smokeless tobacco: Never  Vaping Use   Vaping status: Never Used  Substance and Sexual Activity   Alcohol use: Yes    Alcohol/week: 0.0 standard drinks of alcohol    Comment: occ   Drug use: No   Sexual activity: Yes    Birth control/protection: None, Surgical    Comment: Tubal Ligation  Other Topics Concern   Not on file  Social History Narrative   Not on file   Social Determinants of Health   Financial Resource Strain: Not on file  Food Insecurity: Not on file  Transportation Needs: Not on file  Physical Activity: Not on file  Stress: Not on file  Social Connections: Not on file  Intimate Partner Violence: Not on file    Review of Systems: See  HPI, otherwise negative ROS  Physical Exam: BP 120/79   Pulse 95   Temp 97.7 F (36.5 C) (Temporal)   Resp 16   Ht 5\' 5"  (1.651 m)   Wt 105.6 kg   LMP 10/19/2022 (Approximate) Comment: Patient has had ablation and tubes tied; no pregnancy test done  SpO2 100%   BMI 38.74 kg/m  General:   Alert,  pleasant and cooperative in NAD Head:  Normocephalic and atraumatic. Neck:  Supple; no masses or thyromegaly. Lungs:  Clear throughout to auscultation.    Heart:  Regular rate and rhythm. Abdomen:  Soft, nontender and nondistended. Normal bowel sounds, without guarding, and without rebound.   Neurologic:  Alert and  oriented x4;  grossly normal neurologically.  Impression/Plan: MONESHA MONREAL is here for an colonoscopy to be performed for positive Cologuard test  Risks, benefits, limitations, and  alternatives regarding  colonoscopy have been reviewed with the patient.  Questions have been answered.  All parties agreeable.   [name], MD  12/08/2022, 7:46 AM

## 2022-12-08 NOTE — Transfer of Care (Signed)
Immediate Anesthesia Transfer of Care Note  Patient: Emily Warren  Procedure(s) Performed: COLONOSCOPY WITH PROPOFOL POLYPECTOMY  Patient Location: Endoscopy Unit  Anesthesia Type:General  Level of Consciousness: awake, alert , and oriented  Airway & Oxygen Therapy: Patient Spontanous Breathing and Patient connected to nasal cannula oxygen  Post-op Assessment: Report given to RN, Post -op Vital signs reviewed and stable, and Patient moving all extremities  Post vital signs: Reviewed and stable  Last Vitals:  Vitals Value Taken Time  BP 103/57 12/08/22 0824  Temp 36.1 C 12/08/22 0824  Pulse 95 12/08/22 0824  Resp 14 12/08/22 0824  SpO2 97 % 12/08/22 0824    Last Pain:  Vitals:   12/08/22 0824  TempSrc: Temporal  PainSc: Asleep         Complications: No notable events documented.

## 2022-12-09 ENCOUNTER — Encounter: Payer: Self-pay | Admitting: Gastroenterology

## 2022-12-09 LAB — SURGICAL PATHOLOGY

## 2022-12-09 NOTE — Anesthesia Postprocedure Evaluation (Signed)
Anesthesia Post Note  Patient: Emily Warren  Procedure(s) Performed: COLONOSCOPY WITH PROPOFOL POLYPECTOMY  Patient location during evaluation: Endoscopy Anesthesia Type: General Level of consciousness: awake and alert Pain management: pain level controlled Vital Signs Assessment: post-procedure vital signs reviewed and stable Respiratory status: spontaneous breathing, nonlabored ventilation and respiratory function stable Cardiovascular status: blood pressure returned to baseline and stable Postop Assessment: no apparent nausea or vomiting Anesthetic complications: no   No notable events documented.   Last Vitals:  Vitals:   12/08/22 0834 12/08/22 0844  BP: 117/68 110/66  Pulse: 71 67  Resp: 16   Temp:    SpO2: 99% 100%    Last Pain:  Vitals:   12/08/22 0844  TempSrc:   PainSc: 0-No pain                 Foye Deer

## 2023-01-22 ENCOUNTER — Ambulatory Visit: Payer: Medicaid Other | Attending: Family

## 2023-01-22 DIAGNOSIS — M25552 Pain in left hip: Secondary | ICD-10-CM | POA: Diagnosis present

## 2023-01-22 NOTE — Therapy (Signed)
OUTPATIENT PHYSICAL THERAPY HIP EVALUATION   Patient Name: Emily Warren MRN: [id number] DOB:[birth year], 47 y.o., female Today's Date: 01/22/2023  END OF SESSION:  PT End of Session - 01/22/23 0930     Visit Number 1    Number of Visits 17    Date for PT Re-Evaluation 03/19/23    Authorization Type eval: 01/22/23    PT Start Time 0931    PT Stop Time 1015    PT Time Calculation (min) 44 min    Activity Tolerance Patient tolerated treatment well    Behavior During Therapy [facility] for tasks assessed/performed            Past Medical History:  Diagnosis Date   BRCA negative 09/2021   MyRisk neg; IBIS=10.6%/riskscore=15.4%   Complication of anesthesia    severe itching after anesthesia with 2 c-sections.   Family history of cancer of extrahepatic bile ducts    Family history of ovarian cancer    1/20; 5/22 cancer genetic testing letter sent   Gestational diabetes    Past Surgical History:  Procedure Laterality Date   CESAREAN SECTION  2007 & 2010   x 2   CESAREAN SECTION     CESAREAN SECTION WITH BILATERAL TUBAL LIGATION N/A 09/03/2016   Procedure: CESAREAN SECTION WITH BILATERAL TUBAL LIGATION;  Surgeon: Nadara Mustard, MD;  Location: [facility] ORS;  Service: Obstetrics;  Laterality: N/A;   COLONOSCOPY WITH PROPOFOL N/A 12/08/2022   Procedure: COLONOSCOPY WITH PROPOFOL;  Surgeon: [name], MD;  Location: [facility] ENDOSCOPY;  Service: Endoscopy;  Laterality: N/A;   DG CHOLECYSTOGRAPHY GALL BLADDER ([facility] HX)     POLYPECTOMY  12/08/2022   Procedure: POLYPECTOMY;  Surgeon: [name], MD;  Location: [facility] ENDOSCOPY;  Service: Endoscopy;;   WISDOM TOOTH EXTRACTION  1997   x 4   Patient Active Problem List   Diagnosis Date Noted   Polyp of sigmoid colon 12/08/2022   Screen for colon cancer 12/08/2022   Family history of cancer of extrahepatic bile ducts 09/10/2021   Acute cholecystitis 02/17/2020   Right-sided low back pain without sciatica 12/21/2016   Family history of  ovarian cancer 10/15/2016   History of tubal ligation 09/16/2016   History of cesarean delivery 05/15/2016   Ganglion 08/16/2009    PCP: Rica Records, PA-C  REFERRING PROVIDER: Ronne Binning, NP  REFERRING DIAG: [phone] (ICD-10-CM) - Left hip pain   RATIONALE FOR EVALUATION AND TREATMENT: Rehabilitation  THERAPY DIAG: Pain in left hip  ONSET DATE: Approximately 1 year ago  FOLLOW-UP APPT SCHEDULED WITH REFERRING PROVIDER: No    SUBJECTIVE:  SUBJECTIVE STATEMENT:  L hip pain  PERTINENT HISTORY:  Pt complains of L hip pain for approximately 1 year. 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 She saw her PCP and was prescribed a round of steroids and muscle relaxers. This alleviated her symptoms but didn't resolve them completely. Unfortunately her L hip pain flared up again this fall. She saw her PCP and has been taking muscle relaxers at night which help slightly. She was also prescribed a steroid taper to take if her symptoms were severe. Until now she has not needed to take the steroid taper and is hoping to avoid it if possible. She complains primarily of significant stiffness with prolonged positioning such as sitting or standing. She hurts initially after changing positions but her symptoms improve with activity. She has a history of chronic bilateral knee pain but it is unrelated to her hip pain. She has also had some intermittent lower abdominal cramping but denies any abnormal bleeding.   PAIN:    Pain Intensity: Present: 0/10, Best: 0/10, Worst: 6/10 Pain location: L lateral hip Pain Quality: stiffness and "ligament stretching"  Radiating: Yes, pain radiates down her anterior L thigh Numbness/Tingling: No Focal Weakness: No Aggravating factors:  moving after prolonged positioning (standing, sitting), standing from a squatted position, occasional pain with stairs Relieving factors: muscle relaxers, steroids, cross body stretching 24-hour pain behavior: activity dependent.  History of prior back or hip injury, pain, surgery, or therapy: Yes, history of chronic low back pain. Pt sees a massage therapist occasionally and has an appointment next week.  Dominant hand: right Imaging: No, no hip radiographs;  Red flags: Positive: History of kidney stones. Negative for bowel/bladder changes, h/o spinal tumors, h/o compression fx, personal history of cancer/malignancy, acute hip trauma, night pain, h/o abdominal aneurysm, abdominal pain, chills/fever, night sweats, nausea, vomiting, diarrhea, unexplained weight gain/loss;  PRECAUTIONS: None  WEIGHT BEARING RESTRICTIONS: No  FALLS: Has patient fallen in last 6 months?  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 Pt would like to be able to get down to the floor, up on her tractor and stand for a prolonged period without pain.    OBJECTIVE:  Patient Surveys  FOTO: 67, predicted improvement to 75;  HAGOS: To be scored;  Cognition Patient is oriented to person, place, and time.  Recent memory is intact.  Remote memory is intact.  Attention span and concentration are intact.  Expressive speech is intact.  Patient's fund of knowledge is within normal limits for educational level.    Gross Musculoskeletal Assessment Tremor: None; Bulk: Normal, no muscle wasting noted; Tone: Normal; No visible step-off along spinal column, no signs of scoliosis, no gross anatomical hip deformities; No erythema, edema, or ecchymosis noted to lateral hip;  GAIT: No antalgia or gait deviations noted.   Posture: Lumbar lordosis: WNL Iliac crest height: Equal  bilaterally Lumbar lateral shift: Negative Leg length: Not assessed;  AROM AROM (Normal range in degrees) AROM   Lumbar   Flexion (65)   Extension (30)   Right lateral flexion (25)   Left lateral flexion (25)   Right rotation (30)   Left rotation (30)       Hip Right Left  Flexion (125) WNL WNL  Extension (15)    Abduction (40)    Adduction (30)  Internal Rotation (45)    External Rotation (45)        Knee    Flexion (135)    Extension (0)        Ankle    Dorsiflexion (20)    Plantarflexion (50)    Inversion (35)    Eversion (15)    (* = pain; Blank rows = not tested)  LE MMT: MMT (out of 5) Right  Left   Hip flexion 5 5  Hip extension    Hip abduction  4+*  Hip adduction 4+   Hip internal rotation 5 5*  Hip external rotation 5 5  Knee flexion 5 5  Knee extension 5 5  Ankle dorsiflexion 5 5  (* = pain; Blank rows = not tested)  Sensation Deferred  Reflexes Deferred  Muscle Length Hamstrings: R: Positive for shortness around 80 degrees L: Positive for shortness around 80 degrees Ely (quadriceps): R: Not examined L: Not examined Thomas (hip flexors): R: Not examined L: Not examined Ober: R: Not examined L: Negative  Palpation Location Right Left         Lumbar paraspinals 0 0  Quadratus Lumborum    Iliac Crest    ASIS    Pubic Rami    Pubic symphysis   Femoral Triangle    Inguinal ligament    Gluteus Maximus  0  Gluteus Medius  1  Deep hip external rotators  0  Sacrum 0  PSIS  1  Fortin's Area (SIJ)  1  Coccyx   Ischial Tuberosity    Greater Trochanter  1  (Blank rows = not tested) Graded on 0-4 scale (0 = no pain, 1 = pain, 2 = pain with wincing/grimacing/flinching, 3 = pain with withdrawal, 4 = unwilling to allow palpation)  Passive Accessory Intervertebral Motion Pt denies reproduction of hip pain with CPA L1-L5 and UPA bilaterally L1-L5. Generally, hypomobile throughout. Hip A/P passive accessory mobility testing is painless with  normal mobility;  Special Tests Lumbar Radiculopathy and Discogenic: Centralization and Peripheralization (SN 92, -LR 0.12): Not examined Slump (SN 83, -LR 0.32): R: Negative L: Negative SLR (SN 92, -LR 0.29): R: Negative L:  Negative Crossed SLR (SP 90): R: Negative L: Negative Pain in L hip with single leg bridge  Facet Joint: Extension-Rotation (SN 100, -LR 0.0): R: Negative L: Negative  Lumbar Foraminal Stenosis: Lumbar quadrant (SN 70): R: Negative L: Negative  Hip: FABER (SN 81): R: Positive for lateral R hip pain L: Positive for lateral L hip pain FADIR (SN 94): R: Negative L: Negative Hip scour (SN 50): R: Negative L: Negative  SIJ:  Thigh Thrust (SN 88, -LR 0.18) : R: Not examined L: Not examined  Piriformis Syndrome: FAIR Test (SN 88, SP 83): R: Not examined L: Not examined  Physical Performance Measures Deferred  Beighton scale Deferred   TODAY'S TREATMENT:  Trigger Point Dry Needling (TDN), unbilled Education performed with patient regarding potential benefit of TDN. Reviewed precautions and risks with patient. Pt provided verbal consent to treatment. TDN performed to L gluteus medius with 2, 0.30 x 75 single needle placements with local twitch response (LTR) and deep ache. Pistoning technique utilized. Soreness reported with ambulation afterward.  Manual Therapy    STM to L lateral hip/gluteus medius with intermittent trigger point release;   PATIENT EDUCATION:  Education details: Plan of care and HEP, gluteus medius tendinopathy. Person educated: Patient Education method: Explanation and Handouts Education comprehension: verbalized understanding   HOME EXERCISE PROGRAM:  Access Code: [email or web address] URL: [email or web address] Date: 01/22/2023 Prepared by: Ria Comment  Exercises - Seated Piriformis Stretch with Trunk Bend  - 1-2 x daily - 7 x weekly - 3 reps - 30s hold - Hooklying Isometric Clamshell (Mirrored)  - 1-2 x daily - 7 x  weekly - 2 sets - 5 reps - 20s hold - Sidelying Hip Abduction (Mirrored)  - 1-2 x daily - 7 x weekly - 2-3 sets - 10 reps - 2s hold  Patient Education - Gluteus Medius Tendinopathy   ASSESSMENT:  CLINICAL IMPRESSION: Patient is a 47 y.o. female who was seen today for physical therapy evaluation and treatment for L hip pain. 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 Performed TDN and STM during evaluation and HEP issued.  OBJECTIVE IMPAIRMENTS: decreased strength and pain.   ACTIVITY LIMITATIONS: bending, sitting, standing, and squatting  PARTICIPATION LIMITATIONS: cleaning, community activity, and occupation  PERSONAL FACTORS: Past/current experiences, Time since onset of injury/illness/exacerbation, and 1 comorbidity: chronic low back pain  are also affecting patient's functional outcome.   REHAB POTENTIAL: Excellent  CLINICAL DECISION MAKING: Stable/uncomplicated  EVALUATION COMPLEXITY: Low   GOALS: Goals reviewed with patient? No  SHORT TERM GOALS: Target date: 02/19/2023  Pt will be independent with HEP in order to improve strength and decrease hip pain to improve pain-free function at home and work. Baseline:  Goal status: INITIAL   LONG TERM GOALS: Target date: 03/19/2023  Pt will increase FOTO to at least 77 to demonstrate significant improvement in function at home and work related to hip pain.  Baseline: 01/22/23: 65 Goal status: INITIAL  2.  Pt will decrease worst hip pain by at least 3 points on the NPRS in order to demonstrate clinically significant reduction in hip pain. Baseline: 01/22/23: 6/10; Goal status: INITIAL  3.  Pt will decrease HAGOS score by at least 20 points in order demonstrate clinically significant reduction in hip pain/disability.       Baseline: To be calculated Goal status: INITIAL   PLAN: PT FREQUENCY: 1-2x/week  PT DURATION: 8 weeks  PLANNED INTERVENTIONS: Therapeutic exercises, Therapeutic activity,  Neuromuscular re-education, Balance training, Gait training, Patient/Family education, Self Care, Joint mobilization, Joint manipulation, Vestibular training, Canalith repositioning, Orthotic/Fit training, DME instructions, Dry Needling, Electrical stimulation, Spinal manipulation, Spinal mobilization, Cryotherapy, Moist heat, Taping, Traction, Ultrasound, Ionotophoresis 4mg /ml Dexamethasone, Manual therapy, and Re-evaluation.  PLAN FOR NEXT SESSION: Assess response to TDN, review/modify HEP, progressive strengthening.   Sharalyn Ink Lymon Kidney PT, DPT, GCS  Kayshaun Polanco, PT 01/22/2023, 8:45 PM

## 2023-01-28 ENCOUNTER — Ambulatory Visit: Payer: Medicaid Other

## 2023-01-28 DIAGNOSIS — M25552 Pain in left hip: Secondary | ICD-10-CM

## 2023-02-09 NOTE — Therapy (Incomplete)
OUTPATIENT PHYSICAL THERAPY HIP TREATMENT   Patient Name: Emily Warren MRN: [id number] DOB:01-27-1976, 47 y.o., female Today's Date: 02/09/2023  END OF SESSION:   Past Medical History:  Diagnosis Date   BRCA negative 09/2021   MyRisk neg; IBIS=10.6%/riskscore=15.4%   Complication of anesthesia    severe itching after anesthesia with 2 c-sections.   Family history of cancer of extrahepatic bile ducts    Family history of ovarian cancer    1/20; 5/22 cancer genetic testing letter sent   Gestational diabetes    Past Surgical History:  Procedure Laterality Date   CESAREAN SECTION  2007 & 2010   x 2   CESAREAN SECTION     CESAREAN SECTION WITH BILATERAL TUBAL LIGATION N/A 09/03/2016   Procedure: CESAREAN SECTION WITH BILATERAL TUBAL LIGATION;  Surgeon: Nadara Mustard, MD;  Location: [facility] ORS;  Service: Obstetrics;  Laterality: N/A;   COLONOSCOPY WITH PROPOFOL N/A 12/08/2022   Procedure: COLONOSCOPY WITH PROPOFOL;  Surgeon: [name], MD;  Location: [facility] ENDOSCOPY;  Service: Endoscopy;  Laterality: N/A;   DG CHOLECYSTOGRAPHY GALL BLADDER ([facility] HX)     POLYPECTOMY  12/08/2022   Procedure: POLYPECTOMY;  Surgeon: [name], MD;  Location: [facility] ENDOSCOPY;  Service: Endoscopy;;   WISDOM TOOTH EXTRACTION  1997   x 4   Patient Active Problem List   Diagnosis Date Noted   Polyp of sigmoid colon 12/08/2022   Screen for colon cancer 12/08/2022   Family history of cancer of extrahepatic bile ducts 09/10/2021   Acute cholecystitis 02/17/2020   Right-sided low back pain without sciatica 12/21/2016   Family history of ovarian cancer 10/15/2016   History of tubal ligation 09/16/2016   History of cesarean delivery 05/15/2016   Ganglion 08/16/2009    PCP: Rica Records, PA-C  REFERRING PROVIDER: Ronne Binning, NP  REFERRING DIAG: [phone] (ICD-10-CM) - Left hip pain   RATIONALE FOR EVALUATION AND TREATMENT: Rehabilitation  THERAPY DIAG: Pain in left hip  ONSET DATE:  Approximately 1 year ago  FOLLOW-UP APPT SCHEDULED WITH REFERRING PROVIDER: No   FROM INITIAL EVALUATION SUBJECTIVE:                                                                                                                                                                                         SUBJECTIVE STATEMENT:  L hip pain  PERTINENT HISTORY:  Pt complains of L hip pain for approximately 1 year. 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 She saw her PCP and was prescribed a round of steroids and muscle relaxers. This alleviated her symptoms but  didn't resolve them completely. Unfortunately her L hip pain flared up again this fall. She saw her PCP and has been taking muscle relaxers at night which help slightly. She was also prescribed a steroid taper to take if her symptoms were severe. Until now she has not needed to take the steroid taper and is hoping to avoid it if possible. She complains primarily of significant stiffness with prolonged positioning such as sitting or standing. She hurts initially after changing positions but her symptoms improve with activity. She has a history of chronic bilateral knee pain but it is unrelated to her hip pain. She has also had some intermittent lower abdominal cramping but denies any abnormal bleeding.   PAIN:    Pain Intensity: Present: 0/10, Best: 0/10, Worst: 6/10 Pain location: L lateral hip Pain Quality: stiffness and "ligament stretching"  Radiating: Yes, pain radiates down her anterior L thigh Numbness/Tingling: No Focal Weakness: No Aggravating factors: moving after prolonged positioning (standing, sitting), standing from a squatted position, occasional pain with stairs Relieving factors: muscle relaxers, steroids, cross body stretching 24-hour pain behavior: activity dependent.  History of prior back or hip injury, pain, surgery, or therapy: Yes, history of chronic low back pain. Pt sees a  massage therapist occasionally and has an appointment next week.  Dominant hand: right Imaging: No, no hip radiographs;  Red flags: Positive: History of kidney stones. Negative for bowel/bladder changes, h/o spinal tumors, h/o compression fx, personal history of cancer/malignancy, acute hip trauma, night pain, h/o abdominal aneurysm, abdominal pain, chills/fever, night sweats, nausea, vomiting, diarrhea, unexplained weight gain/loss;  PRECAUTIONS: None  WEIGHT BEARING RESTRICTIONS: No  FALLS: Has patient fallen in last 6 months?  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 Pt would like to be able to get down to the floor, up on her tractor and stand for a prolonged period without pain.    OBJECTIVE:  Patient Surveys  FOTO: 47, predicted improvement to 49;  HAGOS: To be scored;  Cognition Patient is oriented to person, place, and time.  Recent memory is intact.  Remote memory is intact.  Attention span and concentration are intact.  Expressive speech is intact.  Patient's fund of knowledge is within normal limits for educational level.    Gross Musculoskeletal Assessment Tremor: None; Bulk: Normal, no muscle wasting noted; Tone: Normal; No visible step-off along spinal column, no signs of scoliosis, no gross anatomical hip deformities; No erythema, edema, or ecchymosis noted to lateral hip;  GAIT: No antalgia or gait deviations noted.   Posture: Lumbar lordosis: WNL Iliac crest height: Equal bilaterally Lumbar lateral shift: Negative Leg length: Not assessed;  AROM AROM (Normal range in degrees) AROM   Lumbar   Flexion (65)   Extension (30)   Right lateral flexion (25)   Left lateral flexion (25)   Right rotation (30)   Left rotation (30)       Hip Right Left  Flexion (125) WNL WNL  Extension (15)    Abduction (40)     Adduction (30)    Internal Rotation (45)    External Rotation (45)        Knee    Flexion (135)    Extension (0)        Ankle    Dorsiflexion (20)    Plantarflexion (50)    Inversion (35)    Eversion (15)    (* =  pain; Blank rows = not tested)  LE MMT: MMT (out of 5) Right  Left   Hip flexion 5 5  Hip extension    Hip abduction  4+*  Hip adduction 4+   Hip internal rotation 5 5*  Hip external rotation 5 5  Knee flexion 5 5  Knee extension 5 5  Ankle dorsiflexion 5 5  (* = pain; Blank rows = not tested)  Sensation Deferred  Reflexes Deferred  Muscle Length Hamstrings: R: Positive for shortness around 80 degrees L: Positive for shortness around 80 degrees Ely (quadriceps): R: Not examined L: Not examined Thomas (hip flexors): R: Not examined L: Not examined Ober: R: Not examined L: Negative  Palpation Location Right Left         Lumbar paraspinals 0 0  Quadratus Lumborum    Iliac Crest    ASIS    Pubic Rami    Pubic symphysis   Femoral Triangle    Inguinal ligament    Gluteus Maximus  0  Gluteus Medius  1  Deep hip external rotators  0  Sacrum 0  PSIS  1  Fortin's Area (SIJ)  1  Coccyx   Ischial Tuberosity    Greater Trochanter  1  (Blank rows = not tested) Graded on 0-4 scale (0 = no pain, 1 = pain, 2 = pain with wincing/grimacing/flinching, 3 = pain with withdrawal, 4 = unwilling to allow palpation)  Passive Accessory Intervertebral Motion Pt denies reproduction of hip pain with CPA L1-L5 and UPA bilaterally L1-L5. Generally, hypomobile throughout. Hip A/P passive accessory mobility testing is painless with normal mobility;  Special Tests Lumbar Radiculopathy and Discogenic: Centralization and Peripheralization (SN 92, -LR 0.12): Not examined Slump (SN 83, -LR 0.32): R: Negative L: Negative SLR (SN 92, -LR 0.29): R: Negative L:  Negative Crossed SLR (SP 90): R: Negative L: Negative Pain in L hip with single leg bridge  Facet  Joint: Extension-Rotation (SN 100, -LR 0.0): R: Negative L: Negative  Lumbar Foraminal Stenosis: Lumbar quadrant (SN 70): R: Negative L: Negative  Hip: FABER (SN 81): R: Positive for lateral R hip pain L: Positive for lateral L hip pain FADIR (SN 94): R: Negative L: Negative Hip scour (SN 50): R: Negative L: Negative  SIJ:  Thigh Thrust (SN 88, -LR 0.18) : R: Not examined L: Not examined  Piriformis Syndrome: FAIR Test (SN 88, SP 83): R: Not examined L: Not examined  Physical Performance Measures Deferred  Beighton scale Deferred   TODAY'S TREATMENT: Assess response to TDN, review/modify HEP, progressive strengthening.  SUBJECTIVE:  PAIN:  Ther-ex    Trigger Point Dry Needling (TDN), unbilled Education performed with patient regarding potential benefit of TDN. Reviewed precautions and risks with patient. Pt provided verbal consent to treatment. TDN performed to L gluteus medius with 2, 0.30 x 75 single needle placements with local twitch response (LTR) and deep ache. Pistoning technique utilized. Soreness reported with ambulation afterward.  Manual Therapy    STM to L lateral hip/gluteus medius with intermittent trigger point release;   PATIENT EDUCATION:  Education details: Plan of care and HEP, gluteus medius tendinopathy. Person educated: Patient Education method: Chief Technology Officer Education comprehension: verbalized understanding   HOME EXERCISE PROGRAM:  Access Code: [email or web address] URL: [email or web address] Date: 01/22/2023 Prepared by: Ria Comment  Exercises - Seated Piriformis Stretch with Trunk Bend  - 1-2 x daily - 7 x weekly - 3 reps - 30s hold - Hooklying Isometric Clamshell (Mirrored)  - 1-2 x  daily - 7 x weekly - 2 sets - 5 reps - 20s hold - Sidelying Hip Abduction (Mirrored)  - 1-2 x daily - 7 x weekly - 2-3 sets - 10 reps - 2s hold  Patient Education - Gluteus Medius Tendinopathy   ASSESSMENT:  CLINICAL  IMPRESSION: Patient is a 47 y.o. female who was seen today for physical therapy evaluation and treatment for L hip pain. 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 Performed TDN and STM during evaluation and HEP issued.  OBJECTIVE IMPAIRMENTS: decreased strength and pain.   ACTIVITY LIMITATIONS: bending, sitting, standing, and squatting  PARTICIPATION LIMITATIONS: cleaning, community activity, and occupation  PERSONAL FACTORS: Past/current experiences, Time since onset of injury/illness/exacerbation, and 1 comorbidity: chronic low back pain  are also affecting patient's functional outcome.   REHAB POTENTIAL: Excellent  CLINICAL DECISION MAKING: Stable/uncomplicated  EVALUATION COMPLEXITY: Low   GOALS: Goals reviewed with patient? No  SHORT TERM GOALS: Target date: 02/19/2023  Pt will be independent with HEP in order to improve strength and decrease hip pain to improve pain-free function at home and work. Baseline:  Goal status: INITIAL   LONG TERM GOALS: Target date: 03/19/2023  Pt will increase FOTO to at least 77 to demonstrate significant improvement in function at home and work related to hip pain.  Baseline: 01/22/23: 65 Goal status: INITIAL  2.  Pt will decrease worst hip pain by at least 3 points on the NPRS in order to demonstrate clinically significant reduction in hip pain. Baseline: 01/22/23: 6/10; Goal status: INITIAL  3.  Pt will decrease HAGOS score by at least 20 points in order demonstrate clinically significant reduction in hip pain/disability.       Baseline: To be calculated Goal status: INITIAL   PLAN: PT FREQUENCY: 1-2x/week  PT DURATION: 8 weeks  PLANNED INTERVENTIONS: Therapeutic exercises, Therapeutic activity, Neuromuscular re-education, Balance training, Gait training, Patient/Family education, Self Care, Joint mobilization, Joint manipulation, Vestibular training, Canalith repositioning, Orthotic/Fit training, DME  instructions, Dry Needling, Electrical stimulation, Spinal manipulation, Spinal mobilization, Cryotherapy, Moist heat, Taping, Traction, Ultrasound, Ionotophoresis 4mg /ml Dexamethasone, Manual therapy, and Re-evaluation.  PLAN FOR NEXT SESSION: Assess response to TDN, review/modify HEP, progressive strengthening.   Sharalyn Ink Loeta Herst PT, DPT, GCS  Tomeshia Pizzi, PT 02/09/2023, 12:21 PM

## 2023-02-11 ENCOUNTER — Ambulatory Visit: Payer: Medicaid Other

## 2023-10-06 ENCOUNTER — Other Ambulatory Visit: Payer: Self-pay | Admitting: Obstetrics and Gynecology

## 2023-10-06 DIAGNOSIS — Z1231 Encounter for screening mammogram for malignant neoplasm of breast: Secondary | ICD-10-CM

## 2023-10-26 ENCOUNTER — Ambulatory Visit
Admission: RE | Admit: 2023-10-26 | Discharge: 2023-10-26 | Disposition: A | Discharge status: Home / Self Care | Source: Ambulatory Visit | Attending: Obstetrics and Gynecology | Admitting: Obstetrics and Gynecology

## 2023-10-26 DIAGNOSIS — Z1231 Encounter for screening mammogram for malignant neoplasm of breast: Secondary | ICD-10-CM | POA: Insufficient documentation

## 2023-10-28 ENCOUNTER — Ambulatory Visit: Payer: Self-pay | Admitting: Obstetrics and Gynecology

## 2023-11-11 NOTE — Progress Notes (Signed)
 PCP:  Watt Bernarda NOVAK, PA-C   Chief Complaint  Patient presents with   Gynecologic Exam    Face flush, irritable, weird cramping when not on cycle.     HPI:      [name] is a 48 y.o. [id number] whose LMP was Patient's last menstrual period was 11/13/2023 (exact date)., presents today for her annual examination.  Her menses are regular every 28-30 days, lasting 2-3 days, dark brown, light flow only. S/p endometrial ablation and menses much improved. Dysmenorrhea mild, improved with NSAIDs; has occas BTB and mid cycle random mild dysmen. Has occas VS sx.   Sex activity: single partner, contraception - tubal ligation. No pain/bleeding/dryness. Last Pap: 07/08/20 Results were: no abnormalities /neg HPV DNA . No pain/bleeding/dryness.  Last mammogram: 10/26/23  Results were: normal--routine follow-up in 12 months There is no FH of breast cancer. There is a FH of bile duct cancer in her mom, ovarian cancer in her mat aunt and pat aunt. Pt is MyRisk neg 7/23. IBIS=10.6%/riskscore=15.4%. She does SBE.   Tobacco use: The patient denies current or previous tobacco use. Alcohol use: social drinker No drug use.  Exercise: mod active  Colonoscopy: 10/24 at [facility] with polyp after POS cologuard 9/24; repeat due after 3 yrs  She does get adequate calcium and Vitamin D in her diet. WNL labs 9/24; FH DM, wants done today.  Has noticed irritability; some days worse than others. Not necessarily related to menstrual cycle. Would prefer non-daily option that doesn't cause wt gain. Did xanax  sparingly in past prn sx.    Patient Active Problem List   Diagnosis Date Noted   Polyp of sigmoid colon 12/08/2022   Screen for colon cancer 12/08/2022   Family history of cancer of extrahepatic bile ducts 09/10/2021   Acute cholecystitis 02/17/2020   Right-sided low back pain without sciatica 12/21/2016   Family history of ovarian cancer 10/15/2016   History of tubal ligation 09/16/2016    History of cesarean delivery 05/15/2016   Ganglion 08/16/2009    Past Surgical History:  Procedure Laterality Date   CESAREAN SECTION  2007 & 2010   x 2   CESAREAN SECTION     CESAREAN SECTION WITH BILATERAL TUBAL LIGATION N/A 09/03/2016   Procedure: CESAREAN SECTION WITH BILATERAL TUBAL LIGATION;  Surgeon: Arloa Lamar SQUIBB, MD;  Location: [facility] ORS;  Service: Obstetrics;  Laterality: N/A;   COLONOSCOPY WITH PROPOFOL  N/A 12/08/2022   Procedure: COLONOSCOPY WITH PROPOFOL ;  Surgeon: Jinny Carmine, MD;  Location: [facility] ENDOSCOPY;  Service: Endoscopy;  Laterality: N/A;   DG CHOLECYSTOGRAPHY GALL BLADDER ([facility] HX)     POLYPECTOMY  12/08/2022   Procedure: POLYPECTOMY;  Surgeon: Jinny Carmine, MD;  Location: [facility] ENDOSCOPY;  Service: Endoscopy;;   WISDOM TOOTH EXTRACTION  1997   x 4    Family History  Problem Relation Age of Onset   Cancer Mother 50       bile duct   Diabetes Mother    Depression Mother    Urolithiasis Mother    Prostate cancer Father        x3   Uterine cancer Maternal Aunt 28   Ovarian cancer Paternal Aunt 63   Brain cancer Paternal Uncle    Diabetes Maternal Grandfather    Urolithiasis Brother    Breast cancer Neg Hx     Social History   Socioeconomic History   Marital status: Married    Spouse name: Not on file   Number of  children: Not on file   Years of education: Not on file   Highest education level: Not on file  Occupational History   Not on file  Tobacco Use   Smoking status: Former    Current packs/day: 0.00    Types: Cigarettes    Quit date: 09/03/2003    Years since quitting: 20.2   Smokeless tobacco: Never  Vaping Use   Vaping status: Never Used  Substance and Sexual Activity   Alcohol use: Yes    Alcohol/week: 0.0 standard drinks of alcohol    Comment: occ   Drug use: No   Sexual activity: Yes    Birth control/protection: None, Surgical    Comment: Tubal Ligation  Other Topics Concern   Not on file  Social History Narrative   Not on  file   Social Drivers of Health   Financial Resource Strain: Not on file  Food Insecurity: Not on file  Transportation Needs: Not on file  Physical Activity: Not on file  Stress: Not on file  Social Connections: Not on file  Intimate Partner Violence: Not on file     Current Outpatient Medications:    Ascorbic Acid (VITAMIN C PO), Take by mouth., Disp: , Rfl:    famotidine (PEPCID) 20 MG tablet, Take 20 mg by mouth as needed for heartburn or indigestion., Disp: , Rfl:    hydrOXYzine  (ATARAX ) 25 MG tablet, Take 1 tablet (25 mg total) by mouth every 6 (six) hours as needed for anxiety., Disp: 30 tablet, Rfl: 1   MAGNESIUM COMPLEX PO, Take by mouth., Disp: , Rfl:    Multiple Vitamin (MULTIVITAMIN PO), Take by mouth., Disp: , Rfl:    VITAMIN D PO, Take by mouth., Disp: , Rfl:      ROS:  Review of Systems  Constitutional:  Negative for fatigue, fever and unexpected weight change.  Respiratory:  Negative for cough, shortness of breath and wheezing.   Cardiovascular:  Negative for chest pain, palpitations and leg swelling.  Gastrointestinal:  Negative for blood in stool, constipation, diarrhea, nausea and vomiting.  Endocrine: Negative for cold intolerance, heat intolerance and polyuria.  Genitourinary:  Negative for dyspareunia, dysuria, flank pain, frequency, genital sores, hematuria, menstrual problem, pelvic pain, urgency, vaginal bleeding, vaginal discharge and vaginal pain.  Musculoskeletal:  Negative for back pain, joint swelling and myalgias.  Skin:  Negative for rash.  Neurological:  Negative for dizziness, syncope, light-headedness, numbness and headaches.  Hematological:  Negative for adenopathy.  Psychiatric/Behavioral:  Positive for agitation. Negative for confusion, sleep disturbance and suicidal ideas. The patient is not nervous/anxious.   BREAST: No symptoms   Objective: BP 118/77   Pulse 77   Ht 5' 5 (1.651 m)   Wt 225 lb (102.1 kg)   LMP 11/13/2023 (Exact  Date)   BMI 37.44 kg/m    Physical Exam Constitutional:      Appearance: She is well-developed.  Genitourinary:     Vulva normal.     Right Labia: No rash, tenderness or lesions.    Left Labia: No tenderness, lesions or rash.    No vaginal discharge, erythema or tenderness.      Right Adnexa: not tender and no mass present.    Left Adnexa: not tender and no mass present.    No cervical friability or polyp.     Uterus is not enlarged or tender.  Breasts:    Right: No mass, nipple discharge, skin change or tenderness.     Left: No mass, nipple  discharge, skin change or tenderness.  Neck:     Thyroid: No thyromegaly.  Cardiovascular:     Rate and Rhythm: Normal rate and regular rhythm.     Heart sounds: Normal heart sounds. No murmur heard. Pulmonary:     Effort: Pulmonary effort is normal.     Breath sounds: Normal breath sounds.  Abdominal:     Palpations: Abdomen is soft.     Tenderness: There is no abdominal tenderness. There is no guarding or rebound.  Musculoskeletal:        General: Normal range of motion.     Cervical back: Normal range of motion.  Lymphadenopathy:     Cervical: No cervical adenopathy.  Neurological:     General: No focal deficit present.     Mental Status: She is alert and oriented to person, place, and time.     Cranial Nerves: No cranial nerve deficit.  Skin:    General: Skin is warm and dry.  Psychiatric:        Mood and Affect: Mood normal.        Behavior: Behavior normal.        Thought Content: Thought content normal.        Judgment: Judgment normal.  Vitals reviewed.       11/15/2023    9:45 AM  GAD 7 : Generalized Anxiety Score  Nervous, Anxious, on Edge 1  Control/stop worrying 1  Worry too much - different things 1  Trouble relaxing 1  Restless 0  Easily annoyed or irritable 2  Afraid - awful might happen 0  Total GAD 7 Score 6  Anxiety Difficulty Somewhat difficult       11/15/2023    9:45 AM  Depression screen PHQ  2/9  Decreased Interest 1  Down, Depressed, Hopeless 1  PHQ - 2 Score 2  Altered sleeping 1  Change in appetite 2  Feeling bad or failure about yourself  1  Trouble concentrating 1  Moving slowly or fidgety/restless 0  PHQ-9 Score 7  Difficult doing work/chores Somewhat difficult    Assessment/Plan: Encounter for annual routine gynecological examination  Cervical cancer screening - Plan: Cytology - PAP  Screening for HPV (human papillomavirus) - Plan: Cytology - PAP  Encounter for screening mammogram for malignant neoplasm of breast; pt current on mammo  Anxiety - Plan: hydrOXYzine  (ATARAX ) 25 MG tablet; try hydroxyzine  prn sx. If doesn't work, can try xanax  sparingly. Pt to f/u prn sx.   Blood tests for routine general physical examination - Plan: Comprehensive metabolic panel with GFR, CBC with Differential/Platelet, Lipid panel, Hemoglobin A1c  Screening cholesterol level - Plan: Lipid panel  Screening for diabetes mellitus - Plan: Hemoglobin A1c  Meds ordered this encounter  Medications   hydrOXYzine  (ATARAX ) 25 MG tablet    Sig: Take 1 tablet (25 mg total) by mouth every 6 (six) hours as needed for anxiety.    Dispense:  30 tablet    Refill:  1    Supervising Provider:   ROBY, MICIA [[id number]]             GYN counsel breast self exam, mammography screening, adequate intake of calcium and vitamin D, diet and exercise     F/U  Return in about 1 year (around 11/14/2024).  Jaiyana Canale B. Caylan Schifano, PA-C 11/15/2023 2:26 PM

## 2023-11-15 ENCOUNTER — Other Ambulatory Visit (HOSPITAL_COMMUNITY)
Admission: RE | Admit: 2023-11-15 | Discharge: 2023-11-15 | Disposition: A | Discharge status: Home / Self Care | Source: Ambulatory Visit | Attending: Obstetrics and Gynecology | Admitting: Obstetrics and Gynecology

## 2023-11-15 ENCOUNTER — Ambulatory Visit: Admitting: Obstetrics and Gynecology

## 2023-11-15 ENCOUNTER — Encounter: Payer: Self-pay | Admitting: Obstetrics and Gynecology

## 2023-11-15 VITALS — BP 118/77 | HR 77 | Ht 65.0 in | Wt 225.0 lb

## 2023-11-15 DIAGNOSIS — Z13220 Encounter for screening for lipoid disorders: Secondary | ICD-10-CM

## 2023-11-15 DIAGNOSIS — Z1151 Encounter for screening for human papillomavirus (HPV): Secondary | ICD-10-CM | POA: Insufficient documentation

## 2023-11-15 DIAGNOSIS — Z1231 Encounter for screening mammogram for malignant neoplasm of breast: Secondary | ICD-10-CM

## 2023-11-15 DIAGNOSIS — Z0000 Encounter for general adult medical examination without abnormal findings: Secondary | ICD-10-CM

## 2023-11-15 DIAGNOSIS — Z01419 Encounter for gynecological examination (general) (routine) without abnormal findings: Secondary | ICD-10-CM

## 2023-11-15 DIAGNOSIS — F419 Anxiety disorder, unspecified: Secondary | ICD-10-CM | POA: Diagnosis not present

## 2023-11-15 DIAGNOSIS — Z124 Encounter for screening for malignant neoplasm of cervix: Secondary | ICD-10-CM | POA: Diagnosis not present

## 2023-11-15 DIAGNOSIS — Z131 Encounter for screening for diabetes mellitus: Secondary | ICD-10-CM | POA: Diagnosis not present

## 2023-11-15 MED ORDER — HYDROXYZINE HCL 25 MG PO TABS
25.0000 mg | ORAL_TABLET | Freq: Four times a day (QID) | ORAL | 1 refills | Status: DC | PRN
Start: 2023-11-15 — End: 2024-01-11

## 2023-11-15 NOTE — Patient Instructions (Signed)
 I value your feedback and you entrusting [place] with your care. If you get a [place] patient survey, I would appreciate you taking the time to let us know about your experience today. Thank you! ? ? ?

## 2023-11-16 ENCOUNTER — Ambulatory Visit: Payer: Self-pay | Admitting: Obstetrics and Gynecology

## 2023-11-16 LAB — CBC WITH DIFFERENTIAL/PLATELET
Basophils Absolute: 0.1 x10E3/uL (ref 0.0–0.2)
Basos: 1 %
EOS (ABSOLUTE): 0.3 x10E3/uL (ref 0.0–0.4)
Eos: 3 %
Hematocrit: 41.6 % (ref 34.0–46.6)
Hemoglobin: 13.6 g/dL (ref 11.1–15.9)
Immature Grans (Abs): 0 x10E3/uL (ref 0.0–0.1)
Immature Granulocytes: 0 %
Lymphocytes Absolute: 2.7 x10E3/uL (ref 0.7–3.1)
Lymphs: 30 %
MCH: 30.6 pg (ref 26.6–33.0)
MCHC: 32.7 g/dL (ref 31.5–35.7)
MCV: 94 fL (ref 79–97)
Monocytes Absolute: 0.5 x10E3/uL (ref 0.1–0.9)
Monocytes: 5 %
Neutrophils Absolute: 5.6 x10E3/uL (ref 1.4–7.0)
Neutrophils: 61 %
Platelets: 290 x10E3/uL (ref 150–450)
RBC: 4.44 x10E6/uL (ref 3.77–5.28)
RDW: 12.4 % (ref 11.7–15.4)
WBC: 9.1 x10E3/uL (ref 3.4–10.8)

## 2023-11-16 LAB — COMPREHENSIVE METABOLIC PANEL WITH GFR
ALT: 26 IU/L (ref 0–32)
AST: 19 IU/L (ref 0–40)
Albumin: 4.2 g/dL (ref 3.9–4.9)
Alkaline Phosphatase: 107 IU/L (ref 44–121)
BUN/Creatinine Ratio: 17 (ref 9–23)
BUN: 12 mg/dL (ref 6–24)
Bilirubin Total: 0.5 mg/dL (ref 0.0–1.2)
CO2: 22 mmol/L (ref 20–29)
Calcium: 9.2 mg/dL (ref 8.7–10.2)
Chloride: 105 mmol/L (ref 96–106)
Creatinine, Ser: 0.71 mg/dL (ref 0.57–1.00)
Globulin, Total: 2.3 g/dL (ref 1.5–4.5)
Glucose: 88 mg/dL (ref 70–99)
Potassium: 4.2 mmol/L (ref 3.5–5.2)
Sodium: 140 mmol/L (ref 134–144)
Total Protein: 6.5 g/dL (ref 6.0–8.5)
eGFR: 106 mL/min/1.73 (ref >59)

## 2023-11-16 LAB — LIPID PANEL
Chol/HDL Ratio: 3.3 ratio (ref 0.0–4.4)
Cholesterol, Total: 174 mg/dL (ref 100–199)
HDL: 53 mg/dL (ref >39)
LDL Chol Calc (NIH): 111 mg/dL — ABNORMAL HIGH (ref 0–99)
Triglycerides: 50 mg/dL (ref 0–149)
VLDL Cholesterol Cal: 10 mg/dL (ref 5–40)

## 2023-11-16 LAB — HEMOGLOBIN A1C
Est. average glucose Bld gHb Est-mCnc: 100 mg/dL
Hgb A1c MFr Bld: 5.1 % (ref 4.8–5.6)

## 2023-11-17 LAB — CYTOLOGY - PAP
Adequacy: ABSENT
Comment: NEGATIVE
Diagnosis: NEGATIVE
High risk HPV: NEGATIVE

## 2024-01-10 ENCOUNTER — Encounter: Payer: Self-pay | Admitting: Obstetrics and Gynecology

## 2024-01-10 DIAGNOSIS — F419 Anxiety disorder, unspecified: Secondary | ICD-10-CM

## 2024-01-11 MED ORDER — BUSPIRONE HCL 7.5 MG PO TABS: 7.5000 mg | ORAL_TABLET | Freq: Two times a day (BID) | ORAL | 1 refills | Status: DC

## 2024-02-06 ENCOUNTER — Other Ambulatory Visit: Payer: Self-pay | Admitting: Obstetrics and Gynecology

## 2024-02-06 DIAGNOSIS — F419 Anxiety disorder, unspecified: Secondary | ICD-10-CM

## 2024-02-07 ENCOUNTER — Other Ambulatory Visit: Payer: Self-pay | Admitting: Obstetrics and Gynecology

## 2024-02-07 DIAGNOSIS — F419 Anxiety disorder, unspecified: Secondary | ICD-10-CM

## 2024-02-07 MED ORDER — BUSPIRONE HCL 10 MG PO TABS: 10.0000 mg | ORAL_TABLET | Freq: Two times a day (BID) | ORAL | 2 refills | Status: DC

## 2024-02-07 NOTE — Progress Notes (Signed)
 Rx buspar  increase to 10 mg BID for persisting sx.

## 2024-02-29 ENCOUNTER — Other Ambulatory Visit: Payer: Self-pay | Admitting: Obstetrics and Gynecology

## 2024-02-29 DIAGNOSIS — F419 Anxiety disorder, unspecified: Secondary | ICD-10-CM

## 2024-04-04 ENCOUNTER — Encounter: Payer: Self-pay | Admitting: Obstetrics and Gynecology

## 2024-04-05 ENCOUNTER — Other Ambulatory Visit: Payer: Self-pay | Admitting: Obstetrics and Gynecology

## 2024-04-05 MED ORDER — ESCITALOPRAM OXALATE 10 MG PO TABS: ORAL_TABLET | ORAL | 1 refills | Status: AC

## 2024-04-05 NOTE — Progress Notes (Signed)
 Rx lexapro  for irritability/moods. F/u in 7 wks via MyChart. Stop buspar  since not working.
